# Patient Record
Sex: Female | Born: 1965
Health system: Southern US, Community
[De-identification: ages and names within clinical notes are randomized; demographics above are authoritative.]

## PROBLEM LIST (undated history)

## (undated) DIAGNOSIS — T8859XA Other complications of anesthesia, initial encounter: Secondary | ICD-10-CM

## (undated) DIAGNOSIS — I219 Acute myocardial infarction, unspecified: Secondary | ICD-10-CM

## (undated) DIAGNOSIS — I639 Cerebral infarction, unspecified: Secondary | ICD-10-CM

## (undated) DIAGNOSIS — I10 Essential (primary) hypertension: Secondary | ICD-10-CM

## (undated) DIAGNOSIS — I429 Cardiomyopathy, unspecified: Secondary | ICD-10-CM

## (undated) DIAGNOSIS — E785 Hyperlipidemia, unspecified: Secondary | ICD-10-CM

## (undated) DIAGNOSIS — F32A Depression, unspecified: Secondary | ICD-10-CM

## (undated) DIAGNOSIS — K219 Gastro-esophageal reflux disease without esophagitis: Secondary | ICD-10-CM

## (undated) DIAGNOSIS — I509 Heart failure, unspecified: Secondary | ICD-10-CM

## (undated) DIAGNOSIS — I2510 Atherosclerotic heart disease of native coronary artery without angina pectoris: Secondary | ICD-10-CM

## (undated) DIAGNOSIS — J449 Chronic obstructive pulmonary disease, unspecified: Secondary | ICD-10-CM

## (undated) DIAGNOSIS — I739 Peripheral vascular disease, unspecified: Secondary | ICD-10-CM

## (undated) DIAGNOSIS — I8390 Asymptomatic varicose veins of unspecified lower extremity: Secondary | ICD-10-CM

## (undated) DIAGNOSIS — I079 Rheumatic tricuspid valve disease, unspecified: Secondary | ICD-10-CM

## (undated) DIAGNOSIS — I359 Nonrheumatic aortic valve disorder, unspecified: Secondary | ICD-10-CM

## (undated) DIAGNOSIS — C539 Malignant neoplasm of cervix uteri, unspecified: Secondary | ICD-10-CM

## (undated) HISTORY — DX: Malignant neoplasm of cervix uteri, unspecified: C53.9

## (undated) HISTORY — DX: Depression, unspecified: F32.A

## (undated) HISTORY — PX: MULTIPLE TOOTH EXTRACTIONS: SHX2053

## (undated) HISTORY — PX: CARDIAC CATHETERIZATION: SHX172

## (undated) HISTORY — DX: Essential (primary) hypertension: I10

## (undated) HISTORY — DX: Rheumatic tricuspid valve disease, unspecified: I07.9

## (undated) HISTORY — DX: Heart failure, unspecified: I50.9

## (undated) HISTORY — DX: Atherosclerotic heart disease of native coronary artery without angina pectoris: I25.10

## (undated) HISTORY — DX: Cardiomyopathy, unspecified: I42.9

## (undated) HISTORY — DX: Asymptomatic varicose veins of unspecified lower extremity: I83.90

## (undated) HISTORY — DX: Cerebral infarction, unspecified: I63.9

## (undated) HISTORY — DX: Peripheral vascular disease, unspecified: I73.9

## (undated) HISTORY — DX: Acute myocardial infarction, unspecified: I21.9

## (undated) HISTORY — PX: CORONARY ANGIOPLASTY WITH STENT PLACEMENT: SHX49

## (undated) HISTORY — PX: PERIPHERAL ARTERIAL STENT GRAFT: SHX2220

## (undated) HISTORY — DX: Nonrheumatic aortic valve disorder, unspecified: I35.9

## (undated) HISTORY — DX: Hyperlipidemia, unspecified: E78.5

## (undated) HISTORY — DX: Chronic obstructive pulmonary disease, unspecified: J44.9

## (undated) HISTORY — DX: Gastro-esophageal reflux disease without esophagitis: K21.9

---

## 2000-07-10 HISTORY — PX: ABDOMINAL HYSTERECTOMY: SHX81

## 2002-07-10 HISTORY — PX: HERNIA REPAIR: SHX51

## 2018-02-13 ENCOUNTER — Telehealth: Payer: Self-pay | Admitting: Cardiology

## 2018-02-13 NOTE — Telephone Encounter (Signed)
error 

## 2018-02-14 ENCOUNTER — Telehealth: Payer: Self-pay | Admitting: *Deleted

## 2018-02-14 NOTE — Telephone Encounter (Signed)
Pt stated she would get by here on 8/9 when she gets a ride and will make sure her records from [place] get here by 8/12, appt is on 8/13 with Dr. Agustin Cree

## 2018-02-18 ENCOUNTER — Encounter: Payer: Self-pay | Admitting: Cardiology

## 2018-02-19 ENCOUNTER — Encounter: Payer: Self-pay | Admitting: Cardiology

## 2018-02-19 ENCOUNTER — Ambulatory Visit (INDEPENDENT_AMBULATORY_CARE_PROVIDER_SITE_OTHER): Payer: Medicare Other | Admitting: Cardiology

## 2018-02-19 ENCOUNTER — Ambulatory Visit: Payer: Medicare Other | Admitting: Cardiology

## 2018-02-19 DIAGNOSIS — I6930 Unspecified sequelae of cerebral infarction: Secondary | ICD-10-CM

## 2018-02-19 DIAGNOSIS — I429 Cardiomyopathy, unspecified: Secondary | ICD-10-CM | POA: Insufficient documentation

## 2018-02-19 DIAGNOSIS — J449 Chronic obstructive pulmonary disease, unspecified: Secondary | ICD-10-CM | POA: Insufficient documentation

## 2018-02-19 DIAGNOSIS — I10 Essential (primary) hypertension: Secondary | ICD-10-CM

## 2018-02-19 DIAGNOSIS — E785 Hyperlipidemia, unspecified: Secondary | ICD-10-CM

## 2018-02-19 DIAGNOSIS — I2510 Atherosclerotic heart disease of native coronary artery without angina pectoris: Secondary | ICD-10-CM

## 2018-02-19 DIAGNOSIS — I739 Peripheral vascular disease, unspecified: Secondary | ICD-10-CM

## 2018-02-19 DIAGNOSIS — J418 Mixed simple and mucopurulent chronic bronchitis: Secondary | ICD-10-CM

## 2018-02-19 DIAGNOSIS — I255 Ischemic cardiomyopathy: Secondary | ICD-10-CM | POA: Diagnosis not present

## 2018-02-19 HISTORY — DX: Unspecified sequelae of cerebral infarction: I69.30

## 2018-02-19 HISTORY — DX: Essential (primary) hypertension: I10

## 2018-02-19 HISTORY — DX: Hyperlipidemia, unspecified: E78.5

## 2018-02-19 HISTORY — DX: Peripheral vascular disease, unspecified: I73.9

## 2018-02-19 HISTORY — DX: Atherosclerotic heart disease of native coronary artery without angina pectoris: I25.10

## 2018-02-19 MED ORDER — ROSUVASTATIN CALCIUM 40 MG PO TABS: 40.0000 mg | ORAL_TABLET | Freq: Every day | ORAL | 3 refills | Status: DC

## 2018-02-19 NOTE — Patient Instructions (Signed)
Medication Instructions:  Your physician has recommended you make the following change in your medication:   Increase: rosuvastatin 40 mg daily.    Labwork: Your physician recommends that you return for lab work today: BMP, LFTS, and lipids.   Testing/Procedures: Your physician has requested that you have an echocardiogram. Echocardiography is a painless test that uses sound waves to create images of your heart. It provides your doctor with information about the size and shape of your heart and how well your heart's chambers and valves are working. This procedure takes approximately one hour. There are no restrictions for this procedure.  Your physician has requested that you have a carotid duplex. This test is an ultrasound of the carotid arteries in your neck. It looks at blood flow through these arteries that supply the brain with blood. Allow one hour for this exam. There are no restrictions or special instructions.    Follow-Up: Your physician recommends that you schedule a follow-up appointment in: 1 month  Any Other Special Instructions Will Be Listed Below (If Applicable).  You have been referred to vascular surgery their office should call you within one week if not please call our office.      If you need a refill on your cardiac medications before your next appointment, please call your pharmacy.  ecEchocardiogram An echocardiogram, or echocardiography, uses sound waves (ultrasound) to produce an image of your heart. The echocardiogram is simple, painless, obtained within a short period of time, and offers valuable information to your health care provider. The images from an echocardiogram can provide information such as:  Evidence of coronary artery disease (CAD).  Heart size.  Heart muscle function.  Heart valve function.  Aneurysm detection.  Evidence of a past heart attack.  Fluid buildup around the heart.  Heart muscle thickening.  Assess heart valve  function.  Tell a health care provider about:  Any allergies you have.  All medicines you are taking, including vitamins, herbs, eye drops, creams, and over-the-counter medicines.  Any problems you or family members have had with anesthetic medicines.  Any blood disorders you have.  Any surgeries you have had.  Any medical conditions you have.  Whether you are pregnant or may be pregnant. What happens before the procedure? No special preparation is needed. Eat and drink normally. What happens during the procedure?  In order to produce an image of your heart, gel will be applied to your chest and a wand-like tool (transducer) will be moved over your chest. The gel will help transmit the sound waves from the transducer. The sound waves will harmlessly bounce off your heart to allow the heart images to be captured in real-time motion. These images will then be recorded.  You may need an IV to receive a medicine that improves the quality of the pictures. What happens after the procedure? You may return to your normal schedule including diet, activities, and medicines, unless your health care provider tells you otherwise. This information is not intended to replace advice given to you by your health care provider. Make sure you discuss any questions you have with your health care provider. Document Released: 06/23/2000 Document Revised: 02/12/2016 Document Reviewed: 03/03/2013 Elsevier Interactive Patient Education  2017 [employer].

## 2018-02-19 NOTE — Progress Notes (Signed)
Cardiology Consultation:    Date:  02/19/2018   ID:  Catherine Crawford, DOB 1966-01-02, MRN [id number]  PCP:  Patient, [name]  Cardiologist:  Jenne Campus, MD   Referring MD: No ref. provider found   Chief Complaint  Patient presents with  . New Patient (Initial Visit)    to establish with cardiology  I cannot walk too far without having pain in my legs  History of Present Illness:    Catherine Crawford is a 52 y.o. female who is being seen today for the evaluation of coronary artery disease as well as peripheral vascular disease at the request of No ref. provider found.  Her story is incredible in October of last year she came to hospital because of acute myocardial infarction as well as acute CVA.  Cardiac catheterization was done and 2 stents were implanted.  1 to LAD in 1 to RCA at that time her ejection fraction was 20 to 25%.  She also suffered from CVA at the same time that involve the right side of her body with numbness as well as peripheral vision loss.  Likely should recover from all the trouble after that should be followed by cardiology team as well as vascular team.  She was also find to have completely occluded aorta distally from renal arteries.  She did have a conversation with vascular surgeon about potentially having grafts done however and she decided to relocate to [place] and she would like to be established as a patient here and continue conversation about her problems.  Overall the biggest symptoms that she got his claudication she is that she can walk from front to the back of [employer] but only with the empty bladder if she get full bladder she will not be able to get there because of pain in her legs she have to wait 45 minutes before pain gets better and then she can continue.  Denies having any chest pain tightness squeezing pressure burning chest.  There is some shortness of breath with exertion.  No dizziness no passing out. She did quit smoking last year  and does not smoke anymore she does have family history of premature coronary artery disease practically all family members and that having early coronary artery disease  Past Medical History:  Diagnosis Date  . AVD (aortic valve disease)   . CAD (coronary artery disease)   . Cardiomyopathy ([place])   . Cervical cancer ([place])   . CHF (congestive heart failure) ([place])   . COPD (chronic obstructive pulmonary disease) ([place])   . GERD (gastroesophageal reflux disease)   . Heart attack ([place])   . Hyperlipidemia   . Hypertension   . PAD (peripheral artery disease) ([place])   . Tricuspid valve disorder   . Varicosities of leg     Past Surgical History:  Procedure Laterality Date  . ABDOMINAL HYSTERECTOMY  2002  . CARDIAC CATHETERIZATION    . HERNIA REPAIR  2004  . PERIPHERAL ARTERIAL STENT GRAFT      Current Medications: Current Meds  Medication Sig  . acetaminophen (TYLENOL) 325 MG tablet Take 650 mg by mouth every 6 (six) hours as needed.  Marland Kitchen aspirin EC 81 MG tablet Take 81 mg by mouth daily.  . carvedilol (COREG) 3.125 MG tablet Take 3.125 mg by mouth 2 (two) times daily with a meal.  . clopidogrel (PLAVIX) 75 MG tablet Take 75 mg by mouth daily.  Marland Kitchen losartan (COZAAR) 100 MG tablet Take 100 mg by mouth  daily.   . nitroGLYCERIN (NITROSTAT) 0.4 MG SL tablet Place 0.4 mg under the tongue every 5 (five) minutes as needed.  . pantoprazole (PROTONIX) 40 MG tablet Take 40 mg by mouth daily.  . rosuvastatin (CRESTOR) 20 MG tablet Take 20 mg by mouth daily.  Marland Kitchen spironolactone (ALDACTONE) 25 MG tablet Take 12.5 mg by mouth 2 (two) times daily.      Allergies:   Morphine and related   Social History   Socioeconomic History  . Marital status: Married    Spouse name: Not on file  . Number of children: Not on file  . Years of education: Not on file  . Highest education level: Not on file  Occupational History  . Not on file  Social Needs  . Financial resource strain: Not on file  . Food  insecurity:    Worry: Not on file    Inability: Not on file  . Transportation needs:    Medical: Not on file    Non-medical: Not on file  Tobacco Use  . Smoking status: Former Smoker    Packs/day: 2.00    Years: 37.00    Pack years: 74.00    Types: Cigarettes    Last attempt to quit: 04/2017    Years since quitting: 0.8  . Smokeless tobacco: Never Used  Substance and Sexual Activity  . Alcohol use: Not Currently  . Drug use: Not Currently  . Sexual activity: Not on file  Lifestyle  . Physical activity:    Days per week: Not on file    Minutes per session: Not on file  . Stress: Not on file  Relationships  . Social connections:    Talks on phone: Not on file    Gets together: Not on file    Attends religious service: Not on file    Active member of club or organization: Not on file    Attends meetings of clubs or organizations: Not on file    Relationship status: Not on file  Other Topics Concern  . Not on file  Social History Narrative  . Not on file     Family History: The patient's family history includes CAD in her brother, father, and mother; Cancer in her brother, father, and mother; Heart attack in her father, mother, and sister. ROS:   Please see the history of present illness.    All 14 point review of systems negative except as described per history of present illness.  EKGs/Labs/Other Studies Reviewed:    The following studies were reviewed today: I reviewed all records that are available that include carotid ultrasounds which showed no critical lesion on both sides.  Also echocardiogram which showed ejection fraction 50 to 55%.  I do not have any report of cardiac catheterization  EKG:  EKG is  ordered today.  The ekg ordered today demonstrates normal sinus rhythm, normal P interval, normal QS complex duration morphology, no ST-T segment changes  Recent Labs: No results found for requested labs within last 8760 hours.  Recent Lipid Panel No results  found for: CHOL, TRIG, HDL, CHOLHDL, VLDL, LDLCALC, LDLDIRECT  Physical Exam:    VS:  BP (!) 154/84 (BP Location: Left Arm, Patient Position: Sitting, Cuff Size: Large)   Pulse 65   Ht 5\' 4"  (1.626 m)   Wt 229 lb 6.4 oz (104.1 kg)   SpO2 97%   BMI 39.38 kg/m     Wt Readings from Last 3 Encounters:  02/19/18 229 lb 6.4 oz (  104.1 kg)     GEN:  Well nourished, well developed in no acute distress HEENT: Normal NECK: No JVD; No carotid bruits LYMPHATICS: No lymphadenopathy CARDIAC: RRR, no murmurs, no rubs, no gallops RESPIRATORY:  Clear to auscultation without rales, wheezing or rhonchi  ABDOMEN: Soft, non-tender, non-distended MUSCULOSKELETAL:  No edema; No deformity  SKIN: Warm and dry NEUROLOGIC:  Alert and oriented x 3 PSYCHIATRIC:  Normal affect   ASSESSMENT:    1. Coronary artery disease involving native coronary artery of native heart without angina pectoris   2. Claudication in peripheral vascular disease ([place])   3. Late effect of cerebrovascular accident (CVA)   4. Ischemic cardiomyopathy   5. Essential hypertension   6. Dyslipidemia   7. Mixed simple and mucopurulent chronic bronchitis (HCC)    PLAN:    In order of problems listed above:  1. Advanced and aggressive atherosclerosis.  Threasa Beards does have coronary artery disease appears to be asymptomatic right now but this is something we will need to reevaluate in the future I will start from getting her echocardiogram to assess left ventricular ejection fraction.  Also carotid ultrasounds will be done to recheck on carotid artery stenosis.  I will refer her to vascular team with consideration of intervening on her lower extremities since she does have completely occluded aorta probably bypass surgery will be the best way to proceed.  If we decided to do it then I will need to do stress testing on her to evaluate her for reactivation of her problem.  Luckily her EKG looks normal today.  Which is surprising.  Again  echocardiogram will be done to recheck left ventricular ejection fraction. 2. Claudication with peripheral vascular disease: Plan as outlined above 3. Ischemic cardia myopathy she is on ARB as well as beta-blocker which is appropriate I will continue.  We will recheck left ventricular ejection fraction doing echocardiogram 4. Essential hypertension: Blood pressure well controlled continue present management. 5. Dyslipidemia I will check a fasting lipid profile today.  I will maximize her medication already putting her on maximal dose of Crestor 40 mg daily. 6. COPD: Luckily she quit smoking.  Overall young lady with multiple medical problems.  I see her back in my office about 3 weeks or sooner she had a problem   Medication Adjustments/Labs and Tests Ordered: Current medicines are reviewed at length with the patient today.  Concerns regarding medicines are outlined above.  No orders of the defined types were placed in this encounter.  No orders of the defined types were placed in this encounter.   Signed, Park Liter, MD, [facility]. 02/19/2018 3:06 PM    [facility]

## 2018-02-20 LAB — LIPID PANEL
CHOLESTEROL TOTAL: 136 mg/dL (ref 100–199)
Chol/HDL Ratio: 2.8 ratio (ref 0.0–4.4)
HDL: 48 mg/dL (ref >39)
LDL CALC: 73 mg/dL (ref 0–99)
Triglycerides: 77 mg/dL (ref 0–149)
VLDL CHOLESTEROL CAL: 15 mg/dL (ref 5–40)

## 2018-02-20 LAB — HEPATIC FUNCTION PANEL
ALT: 10 IU/L (ref 0–32)
AST: 10 IU/L (ref 0–40)
Albumin: 4.1 g/dL (ref 3.5–5.5)
Alkaline Phosphatase: 91 IU/L (ref 39–117)
Bilirubin Total: 0.2 mg/dL (ref 0.0–1.2)
Bilirubin, Direct: 0.05 mg/dL (ref 0.00–0.40)
TOTAL PROTEIN: 7.2 g/dL (ref 6.0–8.5)

## 2018-02-20 LAB — BASIC METABOLIC PANEL WITH GFR
BUN/Creatinine Ratio: 13 (ref 9–23)
BUN: 15 mg/dL (ref 6–24)
CO2: 25 mmol/L (ref 20–29)
Calcium: 9.3 mg/dL (ref 8.7–10.2)
Chloride: 104 mmol/L (ref 96–106)
Creatinine, Ser: 1.14 mg/dL — ABNORMAL HIGH (ref 0.57–1.00)
GFR calc Af Amer: 63 mL/min/1.73
GFR calc non Af Amer: 55 mL/min/1.73 — ABNORMAL LOW
Glucose: 93 mg/dL (ref 65–99)
Potassium: 4.4 mmol/L (ref 3.5–5.2)
Sodium: 143 mmol/L (ref 134–144)

## 2018-02-21 ENCOUNTER — Other Ambulatory Visit: Payer: Self-pay

## 2018-02-21 ENCOUNTER — Telehealth: Payer: Self-pay

## 2018-02-21 DIAGNOSIS — I739 Peripheral vascular disease, unspecified: Secondary | ICD-10-CM

## 2018-02-21 NOTE — Telephone Encounter (Signed)
Patient was notified of lab results.

## 2018-03-21 ENCOUNTER — Ambulatory Visit: Payer: Medicare Other | Admitting: Cardiology

## 2018-03-22 ENCOUNTER — Telehealth: Payer: Self-pay | Admitting: *Deleted

## 2018-03-22 NOTE — Telephone Encounter (Signed)
Advised patient to consult with dentist about oral surgery referral. Patient verbally understands.

## 2018-03-22 NOTE — Telephone Encounter (Signed)
Pt would like recommendation for dental surgeon in our area. Needs some teeth pulled and with her heart issues didn't want to go to just anybody. Please advise

## 2018-03-26 ENCOUNTER — Other Ambulatory Visit: Payer: Self-pay

## 2018-03-26 ENCOUNTER — Other Ambulatory Visit: Payer: Self-pay | Admitting: Vascular Surgery

## 2018-03-26 ENCOUNTER — Encounter: Payer: Self-pay | Admitting: Vascular Surgery

## 2018-03-26 ENCOUNTER — Ambulatory Visit (INDEPENDENT_AMBULATORY_CARE_PROVIDER_SITE_OTHER): Payer: Medicare Other | Admitting: Vascular Surgery

## 2018-03-26 ENCOUNTER — Ambulatory Visit (HOSPITAL_COMMUNITY)
Admission: RE | Admit: 2018-03-26 | Discharge: 2018-03-26 | Disposition: A | Discharge status: Home / Self Care | Payer: Medicare Other | Source: Ambulatory Visit | Attending: Vascular Surgery | Admitting: Vascular Surgery

## 2018-03-26 VITALS — BP 158/68 | HR 71 | Resp 18 | Ht 64.0 in | Wt 232.0 lb

## 2018-03-26 DIAGNOSIS — I739 Peripheral vascular disease, unspecified: Secondary | ICD-10-CM | POA: Insufficient documentation

## 2018-03-26 NOTE — Progress Notes (Signed)
Patient name: Catherine Crawford MRN: [id number] DOB: 1965/07/28 Sex: female  REASON FOR CONSULT: Lifestyle limiting claudication of the bilateral lower extremities with history of aortic occlusion  HPI: Catherine Crawford is a 52 y.o. female with history of hypertension, hyperlipidemia, COPD, coronary artery disease status post MI and stroke October 2018 that presents for evaluation of bilateral lower extremity claudication/leg pain in the setting of aortic occlusion.  Patient reports she has been followed by a vascular surgeon at [facility] in [place].  She describes having at least 10 stents put in her bilateral lower extremities.  We do not have any operative notes from her lower extremity interventions.  She does carr a card in her wallet that suggests she has 7 mm stents in the bilateral iliac arteries. She states she had a CT scan done of her abdominal aorta in May 2019 in [place] that showed an infrarenal aortic occlusion.  She was ultimately referred to a vascular surgeon.  Patient states her husband moved her here to [place] in the interim and she is not had any surgery scheduled at this time.  Sounds like she has mostly cramping pain in both of her calves and feet with walking.  States she cannot walk more than 100 feet and has to stop.  Unable to work.  She has no tissue loss at this time.  Regarding rest pain she has some atypical symptoms in both of her feet at night that she describes as cramping in the arches of her feet that awaken her from sleep.  Patient states she stopped smoking after her stroke and MI in October 2018.  She previously smoked for 37 years.  Regarding her stroke she says she has residual numbness of her right face, right arm, and right leg but is still able to walk.  Regarding abdominal surgery she states she has had a previous hysterectomy.  Past Medical History:  Diagnosis Date  . AVD (aortic valve disease)   . CAD (coronary artery disease)   .  Cardiomyopathy ([place])   . Cervical cancer ([place])   . CHF (congestive heart failure) ([place])   . COPD (chronic obstructive pulmonary disease) ([place])   . GERD (gastroesophageal reflux disease)   . Heart attack ([place])   . Hyperlipidemia   . Hypertension   . PAD (peripheral artery disease) ([place])   . Stroke ([place])   . Tricuspid valve disorder   . Varicosities of leg     Past Surgical History:  Procedure Laterality Date  . ABDOMINAL HYSTERECTOMY  2002  . CARDIAC CATHETERIZATION    . CORONARY ANGIOPLASTY WITH STENT PLACEMENT    . HERNIA REPAIR  2004  . PERIPHERAL ARTERIAL STENT GRAFT      Family History  Problem Relation Age of Onset  . Heart attack Mother   . CAD Mother   . Cancer Mother   . Heart disease Mother   . AAA (abdominal aortic aneurysm) Mother   . Heart attack Father   . CAD Father   . Cancer Father   . Heart disease Father   . Heart attack Sister   . Heart disease Sister   . CAD Brother   . Cancer Brother   . Heart disease Brother     SOCIAL HISTORY: Social History   Socioeconomic History  . Marital status: Married    Spouse name: Not on file  . Number of children: Not on file  . Years of education: Not on file  . Highest education  level: Not on file  Occupational History  . Not on file  Social Needs  . Financial resource strain: Not on file  . Food insecurity:    Worry: Not on file    Inability: Not on file  . Transportation needs:    Medical: Not on file    Non-medical: Not on file  Tobacco Use  . Smoking status: Former Smoker    Packs/day: 2.00    Years: 37.00    Pack years: 74.00    Types: Cigarettes    Last attempt to quit: 04/2017    Years since quitting: 0.9  . Smokeless tobacco: Never Used  Substance and Sexual Activity  . Alcohol use: Not Currently  . Drug use: Not Currently  . Sexual activity: Not on file  Lifestyle  . Physical activity:    Days per week: Not on file    Minutes per session: Not on file  . Stress: Not on file    Relationships  . Social connections:    Talks on phone: Not on file    Gets together: Not on file    Attends religious service: Not on file    Active member of club or organization: Not on file    Attends meetings of clubs or organizations: Not on file    Relationship status: Not on file  . Intimate partner violence:    Fear of current or ex partner: Not on file    Emotionally abused: Not on file    Physically abused: Not on file    Forced sexual activity: Not on file  Other Topics Concern  . Not on file  Social History Narrative  . Not on file    Allergies  Allergen Reactions  . Morphine And Related Nausea And Vomiting    Current Outpatient Medications  Medication Sig Dispense Refill  . acetaminophen (TYLENOL) 325 MG tablet Take 650 mg by mouth every 6 (six) hours as needed.    Marland Kitchen aspirin EC 81 MG tablet Take 81 mg by mouth daily.    . carvedilol (COREG) 3.125 MG tablet Take 3.125 mg by mouth 2 (two) times daily with a meal.    . clopidogrel (PLAVIX) 75 MG tablet Take 75 mg by mouth daily.    Marland Kitchen losartan (COZAAR) 100 MG tablet Take 100 mg by mouth daily.     . nitroGLYCERIN (NITROSTAT) 0.4 MG SL tablet Place 0.4 mg under the tongue every 5 (five) minutes as needed.    . pantoprazole (PROTONIX) 40 MG tablet Take 40 mg by mouth daily.    . rosuvastatin (CRESTOR) 40 MG tablet Take 1 tablet (40 mg total) by mouth daily. 90 tablet 3  . spironolactone (ALDACTONE) 25 MG tablet Take 12.5 mg by mouth 2 (two) times daily.      No current facility-administered medications for this visit.     REVIEW OF SYSTEMS:  [X]  denotes positive finding, [ ]  denotes negative finding Cardiac  Comments:  Chest pain or chest pressure:    Shortness of breath upon exertion: x   Short of breath when lying flat:    Irregular heart rhythm:        Vascular    Pain in calf, thigh, or hip brought on by ambulation: x   Pain in feet at night that wakes you up from your sleep:     Blood clot in your  veins:    Leg swelling:  x       Pulmonary    Oxygen  at home:    Productive cough:     Wheezing:         Neurologic    Sudden weakness in arms or legs:     Sudden numbness in arms or legs:     Sudden onset of difficulty speaking or slurred speech:    Temporary loss of vision in one eye:     Problems with dizziness:         Gastrointestinal    Blood in stool:     Vomited blood:         Genitourinary    Burning when urinating:     Blood in urine:        Psychiatric    Major depression:         Hematologic    Bleeding problems:    Problems with blood clotting too easily:        Skin    Rashes or ulcers:        Constitutional    Fever or chills:      PHYSICAL EXAM: Vitals:   03/26/18 1149 03/26/18 1153  BP: (!) 161/65 (!) 158/68  Pulse: 71   Resp: 18   SpO2: 100%   Weight: 232 lb (105.2 kg)   Height: 5\' 4"  (1.626 m)     GENERAL: The patient is a well-nourished female, in no acute distress. The vital signs are documented above. CARDIAC: There is a regular rate and rhythm.  VASCULAR:  2+ palpable radial pulse palpable bilateral upper extremities. 2+ palpable brachial pulse bilateral upper extremity. No palpable femoral pulses in either groin. No palpable pedal pulses in either lower extremity. Monophasic dorsalis pedis and posterior tibial signals in both feet. No signs of tissue loss in either foot. PULMONARY: There is good air exchange bilaterally without wheezing or rales. ABDOMEN: Obese with large pannus.  Soft and non-tender with normal pitched bowel sounds.  MUSCULOSKELETAL: There are no major deformities or cyanosis. NEUROLOGIC: No focal weakness or paresthesias are detected. SKIN: There are no ulcers or rashes noted. PSYCHIATRIC: The patient has a normal affect.  DATA:   We do not have her CT scan from [place] for review.  There is also no report in the chart from the CT scan.  Assessment/Plan:  52 year old female with lifestyle limiting  claudication of bilateral lower extremities in the setting of multiple iliac and infrainguinal endovascular interventions in [place].  I do not have her CT to review but she states she was told she had a infrarenal aortic occlusion.  I cannot palpate femoral pulses in either groin which would be consistent with her stated history.  Her ABIs today are 0.4 in the bilateral lower extremity with monophasic waveforms.  She has no tissue loss at this time and mainly endorses lifestyle limiting claudication with some atypical rest pain symptoms.  We will plan to obtain a new CTA abdomen pelvis with bilateral lower extremity runoff. In the interim she has also been seen by cardiology here and has an echocardiogram scheduled for the end of this month.  My plan would be to see her back the beginning of October once her CTA, echocardiogram, and subsequent follow-up with cardiology are complete.  At that time we will have a better idea about her options moving forward.  Discussed aortobifem versus axillary bifem pending her cardiac clearance.  States her heart was so bad in [place] the surgeon told her she probably couldn't undergo an aortic operation.   Marty Heck, MD [facility] Office: [phone] Pager: [place]

## 2018-04-03 ENCOUNTER — Telehealth: Payer: Self-pay | Admitting: Cardiology

## 2018-04-03 NOTE — Telephone Encounter (Signed)
No need for abx

## 2018-04-03 NOTE — Telephone Encounter (Signed)
Left message for patient to call office back.

## 2018-04-03 NOTE — Telephone Encounter (Signed)
Patient needs a call regarding being put on antibiotic for dental surgery.

## 2018-04-04 NOTE — Telephone Encounter (Signed)
Informed patient that she does not need abx.

## 2018-04-08 ENCOUNTER — Ambulatory Visit (INDEPENDENT_AMBULATORY_CARE_PROVIDER_SITE_OTHER): Payer: Medicare Other

## 2018-04-08 DIAGNOSIS — I2510 Atherosclerotic heart disease of native coronary artery without angina pectoris: Secondary | ICD-10-CM

## 2018-04-08 DIAGNOSIS — I255 Ischemic cardiomyopathy: Secondary | ICD-10-CM

## 2018-04-08 DIAGNOSIS — I6930 Unspecified sequelae of cerebral infarction: Secondary | ICD-10-CM

## 2018-04-08 NOTE — Progress Notes (Signed)
Complete echocardiogram has been performed.  Jimmy Zakyah Yanes RDCS, RVT 

## 2018-04-08 NOTE — Progress Notes (Addendum)
Carotid duplex exam has been performed. Bilateral atherosclerosis, Right ECA stenosis > 50%, bilateral ICA stenosis 1-39%

## 2018-04-10 ENCOUNTER — Encounter: Payer: Self-pay | Admitting: Cardiology

## 2018-04-10 ENCOUNTER — Ambulatory Visit (INDEPENDENT_AMBULATORY_CARE_PROVIDER_SITE_OTHER): Payer: Medicare Other | Admitting: Cardiology

## 2018-04-10 VITALS — BP 130/64 | HR 55 | Ht 64.0 in | Wt 231.8 lb

## 2018-04-10 DIAGNOSIS — I2510 Atherosclerotic heart disease of native coronary artery without angina pectoris: Secondary | ICD-10-CM | POA: Diagnosis not present

## 2018-04-10 DIAGNOSIS — I10 Essential (primary) hypertension: Secondary | ICD-10-CM

## 2018-04-10 DIAGNOSIS — E785 Hyperlipidemia, unspecified: Secondary | ICD-10-CM

## 2018-04-10 DIAGNOSIS — J418 Mixed simple and mucopurulent chronic bronchitis: Secondary | ICD-10-CM | POA: Diagnosis not present

## 2018-04-10 DIAGNOSIS — I6930 Unspecified sequelae of cerebral infarction: Secondary | ICD-10-CM

## 2018-04-10 DIAGNOSIS — I255 Ischemic cardiomyopathy: Secondary | ICD-10-CM | POA: Diagnosis not present

## 2018-04-10 DIAGNOSIS — I739 Peripheral vascular disease, unspecified: Secondary | ICD-10-CM

## 2018-04-10 MED ORDER — LOSARTAN POTASSIUM 100 MG PO TABS: 100.0000 mg | ORAL_TABLET | Freq: Every day | ORAL | 1 refills | Status: DC

## 2018-04-10 MED ORDER — EZETIMIBE 10 MG PO TABS: 10.0000 mg | ORAL_TABLET | Freq: Every day | ORAL | 1 refills | Status: DC

## 2018-04-10 NOTE — Patient Instructions (Signed)
Medication Instructions:  Your physician has recommended you make the following change in your medication:  START Zetia 10 mg 1 tab daily  Labwork: None ordered  Testing/Procedures: Your physician has requested that you have a lexiscan myoview. For further information please visit [email or web address]. Please follow instruction sheet, as given.  Follow-Up: Your physician recommends that you schedule a follow-up appointment in: 2 months with Dr. Agustin Cree   Any Other Special Instructions Will Be Listed Below (If Applicable).     If you need a refill on your cardiac medications before your next appointment, please call your pharmacy.

## 2018-04-10 NOTE — Progress Notes (Signed)
Cardiology Office Note:    Date:  04/10/2018   ID:  Catherine Crawford, DOB 1966-01-01, MRN [id number]  PCP:  Patient, [name]  Cardiologist:  Jenne Campus, MD    Referring MD: No ref. provider found   Chief Complaint  Patient presents with  . Follow up on testing  Doing well  History of Present Illness:    Catherine Crawford is a 52 y.o. female with multiple medical problems.  He does have significant advanced and aggressive atherosclerosis which include coronary artery disease status post myocardial infarction last year with a complication stroke.  Also history of peripheral vascular disease with what appears to be completely occluded distal aorta.  She comes today to my office for follow-up she is doing fine obviously she does have claudication while walking but no tightness squeezing pressure burning chest.  She did see dentist and she required multiple teeth extraction.  This can be done under local anesthesia which have no objections from doing it.  She also seen vascular specialist with consideration of aortoiliac grafts.  She is scheduled to have CT tomorrow to clearly delineate what the problem seems to be.  From my point of view she will require stress testing before surgery since surgery is considered a significant risk for somebody with advanced coronary artery disease.  But first I will let her recover from her dental surgery.  Past Medical History:  Diagnosis Date  . AVD (aortic valve disease)   . CAD (coronary artery disease)   . Cardiomyopathy ([place])   . Cervical cancer ([place])   . CHF (congestive heart failure) ([place])   . COPD (chronic obstructive pulmonary disease) ([place])   . GERD (gastroesophageal reflux disease)   . Heart attack ([place])   . Hyperlipidemia   . Hypertension   . PAD (peripheral artery disease) ([place])   . Stroke ([place])   . Tricuspid valve disorder   . Varicosities of leg     Past Surgical History:  Procedure Laterality Date  . ABDOMINAL HYSTERECTOMY   2002  . CARDIAC CATHETERIZATION    . CORONARY ANGIOPLASTY WITH STENT PLACEMENT    . HERNIA REPAIR  2004  . PERIPHERAL ARTERIAL STENT GRAFT      Current Medications: Current Meds  Medication Sig  . acetaminophen (TYLENOL) 325 MG tablet Take 650 mg by mouth every 6 (six) hours as needed.  Marland Kitchen aspirin EC 81 MG tablet Take 81 mg by mouth daily.  . carvedilol (COREG) 3.125 MG tablet Take 3.125 mg by mouth 2 (two) times daily with a meal.  . clopidogrel (PLAVIX) 75 MG tablet Take 75 mg by mouth daily.  Marland Kitchen losartan (COZAAR) 100 MG tablet Take 100 mg by mouth daily.   . nitroGLYCERIN (NITROSTAT) 0.4 MG SL tablet Place 0.4 mg under the tongue every 5 (five) minutes as needed.  . pantoprazole (PROTONIX) 40 MG tablet Take 40 mg by mouth daily.  . rosuvastatin (CRESTOR) 40 MG tablet Take 1 tablet (40 mg total) by mouth daily.  Marland Kitchen spironolactone (ALDACTONE) 25 MG tablet Take 12.5 mg by mouth 2 (two) times daily.      Allergies:   Morphine and related   Social History   Socioeconomic History  . Marital status: Married    Spouse name: Not on file  . Number of children: Not on file  . Years of education: Not on file  . Highest education level: Not on file  Occupational History  . Not on file  Social Needs  . Financial  resource strain: Not on file  . Food insecurity:    Worry: Not on file    Inability: Not on file  . Transportation needs:    Medical: Not on file    Non-medical: Not on file  Tobacco Use  . Smoking status: Former Smoker    Packs/day: 2.00    Years: 37.00    Pack years: 74.00    Types: Cigarettes    Last attempt to quit: 04/2017    Years since quitting: 1.0  . Smokeless tobacco: Never Used  Substance and Sexual Activity  . Alcohol use: Not Currently  . Drug use: Not Currently  . Sexual activity: Not on file  Lifestyle  . Physical activity:    Days per week: Not on file    Minutes per session: Not on file  . Stress: Not on file  Relationships  . Social connections:     Talks on phone: Not on file    Gets together: Not on file    Attends religious service: Not on file    Active member of club or organization: Not on file    Attends meetings of clubs or organizations: Not on file    Relationship status: Not on file  Other Topics Concern  . Not on file  Social History Narrative  . Not on file     Family History: The patient's family history includes AAA (abdominal aortic aneurysm) in her mother; CAD in her brother, father, and mother; Cancer in her brother, father, and mother; Heart attack in her father, mother, and sister; Heart disease in her brother, father, mother, and sister. ROS:   Please see the history of present illness.    All 14 point review of systems negative except as described per history of present illness  EKGs/Labs/Other Studies Reviewed:      Recent Labs: 02/19/2018: ALT 10; BUN 15; Creatinine, Ser 1.14; Potassium 4.4; Sodium 143  Recent Lipid Panel    Component Value Date/Time   CHOL 136 02/19/2018 1529   TRIG 77 02/19/2018 1529   HDL 48 02/19/2018 1529   CHOLHDL 2.8 02/19/2018 1529   LDLCALC 73 02/19/2018 1529    Physical Exam:    VS:  BP 130/64   Pulse (!) 55   Ht 5\' 4"  (1.626 m)   Wt 231 lb 12.8 oz (105.1 kg)   SpO2 94%   BMI 39.79 kg/m     Wt Readings from Last 3 Encounters:  04/10/18 231 lb 12.8 oz (105.1 kg)  03/26/18 232 lb (105.2 kg)  02/19/18 229 lb 6.4 oz (104.1 kg)     GEN:  Well nourished, well developed in no acute distress HEENT: Normal NECK: No JVD; No carotid bruits LYMPHATICS: No lymphadenopathy CARDIAC: RRR, no murmurs, no rubs, no gallops RESPIRATORY:  Clear to auscultation without rales, wheezing or rhonchi  ABDOMEN: Soft, non-tender, non-distended MUSCULOSKELETAL:  No edema; No deformity  SKIN: Warm and dry LOWER EXTREMITIES: no swelling NEUROLOGIC:  Alert and oriented x 3 PSYCHIATRIC:  Normal affect   ASSESSMENT:    1. Coronary artery disease involving native coronary artery  of native heart without angina pectoris   2. Ischemic cardiomyopathy   3. Essential hypertension   4. Mixed simple and mucopurulent chronic bronchitis ([place])   5. Dyslipidemia   6. Late effect of cerebrovascular accident (CVA)   7. Claudication in peripheral vascular disease ([place])    PLAN:    In order of problems listed above:  1. Coronary artery disease plan as  outlined above we will schedule her to have [place] which will be done probably in about a month.  In the meantime we will continue present medications. 2. Ischemic cardiomyopathy.  Last ejection fraction actually preserved she is on appropriate medications which I will continue. 3. Essential hypertension blood pressure well controlled we will continue present management. 4. Dyslipidemia her LDL is still more than 70 I will add Zetia to her medical regimen. 5. Late effect of CVA stable. 6. Claudication: Followed by vascular surgery with intention to intervene.  CT scheduled for tomorrow. 7. Smoking she quit more than year ago and I encouraged her to stay away from smoking.   Medication Adjustments/Labs and Tests Ordered: Current medicines are reviewed at length with the patient today.  Concerns regarding medicines are outlined above.  No orders of the defined types were placed in this encounter.  Medication changes: No orders of the defined types were placed in this encounter.   Signed, Park Liter, MD, [facility] 04/10/2018 2:50 PM    [place]

## 2018-04-11 ENCOUNTER — Ambulatory Visit
Admission: RE | Admit: 2018-04-11 | Discharge: 2018-04-11 | Disposition: A | Discharge status: Home / Self Care | Payer: Medicare Other | Source: Ambulatory Visit | Attending: Vascular Surgery | Admitting: Vascular Surgery

## 2018-04-11 DIAGNOSIS — I739 Peripheral vascular disease, unspecified: Secondary | ICD-10-CM

## 2018-04-11 MED ORDER — IOPAMIDOL (ISOVUE-370) INJECTION 76%
125.0000 mL | Freq: Once | INTRAVENOUS | Status: AC | PRN
  Administered 2018-04-11: 125 mL via INTRAVENOUS

## 2018-04-16 ENCOUNTER — Other Ambulatory Visit: Payer: Self-pay

## 2018-04-16 ENCOUNTER — Encounter: Payer: Self-pay | Admitting: Vascular Surgery

## 2018-04-16 ENCOUNTER — Ambulatory Visit (INDEPENDENT_AMBULATORY_CARE_PROVIDER_SITE_OTHER): Payer: Medicare Other | Admitting: Vascular Surgery

## 2018-04-16 ENCOUNTER — Ambulatory Visit: Payer: Medicare Other | Admitting: Vascular Surgery

## 2018-04-16 DIAGNOSIS — I700 Atherosclerosis of aorta: Secondary | ICD-10-CM

## 2018-04-16 DIAGNOSIS — I7410 Embolism and thrombosis of unspecified parts of aorta: Secondary | ICD-10-CM | POA: Diagnosis not present

## 2018-04-16 HISTORY — DX: Atherosclerosis of aorta: I70.0

## 2018-04-16 NOTE — Progress Notes (Signed)
Patient name: Catherine Crawford MRN: [id number] DOB: [birth year] Sex: female  REASON FOR CONSULT: Lifestyle limiting claudication of the bilateral lower extremities with history of aortic occlusion  HPI: Catherine Crawford is a 52 y.o. female with history of hypertension, hyperlipidemia, COPD, coronary artery disease status post MI and stroke October 2018 that presents for evaluation of bilateral lower extremity claudication/leg pain in the setting of aortic occlusion.  Patient reports she has been followed by a vascular surgeon at [facility] in [place].  Presents today after updated CTA was obtained.    States her feet are about the same.  Still cannot walk more than about 100 feet due to severe cramping in both of her calves.  She has been seen by Dr. Agustin Cree her cardiologist and had a echocardiogram with a preserved EF.  She is scheduled for a Lexiscan beginning of November for cardiac clearance.  In the interim she has been seen by dentist and has several tooth abscesses.  She has a referral with a oral surgeon next week to have her teeth pulled.  She did get her CTA in the interim which I have been able to review and it shows a infrarenal aortic occlusion just below her renal arteries as well as occlusion of her bilateral iliacs.  All of the aortoiliac endovascular stent work is occluded.  She appears to reconstitute her common femoral arteries bilaterally.  Past Medical History:  Diagnosis Date  . AVD (aortic valve disease)   . CAD (coronary artery disease)   . Cardiomyopathy ([place])   . Cervical cancer ([place])   . CHF (congestive heart failure) ([place])   . COPD (chronic obstructive pulmonary disease) ([place])   . GERD (gastroesophageal reflux disease)   . Heart attack ([place])   . Hyperlipidemia   . Hypertension   . PAD (peripheral artery disease) ([place])   . Stroke ([place])   . Tricuspid valve disorder   . Varicosities of leg     Past Surgical History:  Procedure Laterality Date  .  ABDOMINAL HYSTERECTOMY  2002  . CARDIAC CATHETERIZATION    . CORONARY ANGIOPLASTY WITH STENT PLACEMENT    . HERNIA REPAIR  2004  . PERIPHERAL ARTERIAL STENT GRAFT      Family History  Problem Relation Age of Onset  . Heart attack Mother   . CAD Mother   . Cancer Mother   . Heart disease Mother   . AAA (abdominal aortic aneurysm) Mother   . Heart attack Father   . CAD Father   . Cancer Father   . Heart disease Father   . Heart attack Sister   . Heart disease Sister   . CAD Brother   . Cancer Brother   . Heart disease Brother     SOCIAL HISTORY: Social History   Socioeconomic History  . Marital status: Married    Spouse name: Not on file  . Number of children: Not on file  . Years of education: Not on file  . Highest education level: Not on file  Occupational History  . Not on file  Social Needs  . Financial resource strain: Not on file  . Food insecurity:    Worry: Not on file    Inability: Not on file  . Transportation needs:    Medical: Not on file    Non-medical: Not on file  Tobacco Use  . Smoking status: Former Smoker    Packs/day: 2.00    Years: 37.00    Pack years: 74.00  Types: Cigarettes    Last attempt to quit: 04/2017    Years since quitting: 1.0  . Smokeless tobacco: Never Used  Substance and Sexual Activity  . Alcohol use: Not Currently  . Drug use: Not Currently  . Sexual activity: Not on file  Lifestyle  . Physical activity:    Days per week: Not on file    Minutes per session: Not on file  . Stress: Not on file  Relationships  . Social connections:    Talks on phone: Not on file    Gets together: Not on file    Attends religious service: Not on file    Active member of club or organization: Not on file    Attends meetings of clubs or organizations: Not on file    Relationship status: Not on file  . Intimate partner violence:    Fear of current or ex partner: Not on file    Emotionally abused: Not on file    Physically abused:  Not on file    Forced sexual activity: Not on file  Other Topics Concern  . Not on file  Social History Narrative  . Not on file    Allergies  Allergen Reactions  . Morphine And Related Nausea And Vomiting    Current Outpatient Medications  Medication Sig Dispense Refill  . acetaminophen (TYLENOL) 325 MG tablet Take 650 mg by mouth every 6 (six) hours as needed.    Marland Kitchen amoxicillin-clavulanate (AUGMENTIN) 875-125 MG tablet   0  . aspirin EC 81 MG tablet Take 81 mg by mouth daily.    . carvedilol (COREG) 3.125 MG tablet Take 3.125 mg by mouth 2 (two) times daily with a meal.    . clopidogrel (PLAVIX) 75 MG tablet Take 75 mg by mouth daily.    Marland Kitchen ezetimibe (ZETIA) 10 MG tablet Take 1 tablet (10 mg total) by mouth daily. 30 tablet 1  . losartan (COZAAR) 100 MG tablet Take 1 tablet (100 mg total) by mouth daily. 90 tablet 1  . nitroGLYCERIN (NITROSTAT) 0.4 MG SL tablet Place 0.4 mg under the tongue every 5 (five) minutes as needed.    . pantoprazole (PROTONIX) 40 MG tablet Take 40 mg by mouth daily.    . rosuvastatin (CRESTOR) 40 MG tablet Take 1 tablet (40 mg total) by mouth daily. 90 tablet 3  . spironolactone (ALDACTONE) 25 MG tablet Take 12.5 mg by mouth 2 (two) times daily.      No current facility-administered medications for this visit.     REVIEW OF SYSTEMS:  [X]  denotes positive finding, [ ]  denotes negative finding Cardiac  Comments:  Chest pain or chest pressure:    Shortness of breath upon exertion:    Short of breath when lying flat:    Irregular heart rhythm:        Vascular    Pain in calf, thigh, or hip brought on by ambulation: x   Pain in feet at night that wakes you up from your sleep:     Blood clot in your veins:    Leg swelling:         Pulmonary    Oxygen at home:    Productive cough:     Wheezing:         Neurologic    Sudden weakness in arms or legs:     Sudden numbness in arms or legs:     Sudden onset of difficulty speaking or slurred speech:  Temporary loss of vision in one eye:     Problems with dizziness:         Gastrointestinal    Blood in stool:     Vomited blood:         Genitourinary    Burning when urinating:     Blood in urine:        Psychiatric    Major depression:         Hematologic    Bleeding problems:    Problems with blood clotting too easily:        Skin    Rashes or ulcers:        Constitutional    Fever or chills:      PHYSICAL EXAM: Vitals:   04/16/18 0932  BP: 127/68  Pulse: 64  Resp: 16  Temp: 97.6 F (36.4 C)  TempSrc: Oral  SpO2: 100%  Weight: 104.8 kg  Height: 5\' 4"  (1.626 m)    GENERAL: The patient is a well-nourished female, in no acute distress. The vital signs are documented above. CARDIAC: There is a regular rate and rhythm.  VASCULAR:  2+ palpable radial pulse palpable bilateral upper extremities. No palpable femoral pulses in either groin. No palpable pedal pulses in either lower extremity. Monophasic dorsalis pedis and posterior tibial signals in both feet. No signs of tissue loss in either foot. PULMONARY: There is good air exchange bilaterally without wheezing or rales. ABDOMEN: Obese with large pannus.  Soft and non-tender with normal pitched bowel sounds.  MUSCULOSKELETAL: There are no major deformities or cyanosis.     DATA:   CTA independently reviewed by me and it shows a infrarenal aortic occlusion just below her renal arteries.  Her aortobiiliac endovascular stents are occluded.  She appears to reconstitute her common femoral arteries bilaterally.  There is to patent runoff in the bilateral lower extremities through SFA popliteal and three-vessel tibial runoff.  Assessment/Plan:   I had a long discussion with Ms. Sick today regarding options moving forward.  After review of her CTA she indeed has an infra-renal aortic occlusion below her renal arteries.  The previous aortobililac stenting done in [place] is all occluded.  She would need either an  aortobifemoral bypass versus axillary bifemoral.  Concerned about patency of axillary based bypass in 52 yo, but her abdomen is hostile given three previous hernia repairs with open mesh and morbidly obese.  In addition, her groins are exceedingly high risk given she is morbidly obese.  However in the interim she has been seen by a dentist and has multiple tooth abscesses and is being referred to a oral surgeon.  Discussed that we need to get this addressed before proceeding with any prosthetic based bypass.  Moreover she has been seen by her cardiologist and an echo is complete but she has a [place] ordered the beginning of November. I proposed we follow-up again after dental work and cardiac stress to make a decision moving forwrad.  She has short distance claudication and her limbs are not in immediate threat.  Marty Heck, MD [facility] Office: [phone] Pager: [place]

## 2018-04-25 ENCOUNTER — Telehealth: Payer: Self-pay | Admitting: Cardiology

## 2018-04-25 NOTE — Telephone Encounter (Signed)
Spoke with Oral surgery center. Faxed clearance there.

## 2018-04-25 NOTE — Telephone Encounter (Signed)
Patient needs clearance for oral surgery for using Versaid and Fentanyl all remaining teeth removed except for two lower canines.. Please fax pre-op note for patient to dental office/[facility]

## 2018-05-09 ENCOUNTER — Telehealth (HOSPITAL_COMMUNITY): Payer: Self-pay | Admitting: *Deleted

## 2018-05-09 NOTE — Telephone Encounter (Signed)
Patient given detailed instructions per Myocardial Perfusion Study Information Sheet for the test on 05/14/18. Patient notified to arrive 15 minutes early and that it is imperative to arrive on time for appointment to keep from having the test rescheduled.  If you need to cancel or reschedule your appointment, please call the office within 24 hours of your appointment. . Patient verbalized understanding. Guillermo Difrancesco Jacqueline    

## 2018-05-14 ENCOUNTER — Ambulatory Visit (INDEPENDENT_AMBULATORY_CARE_PROVIDER_SITE_OTHER): Payer: Medicare Other

## 2018-05-14 ENCOUNTER — Telehealth: Payer: Self-pay | Admitting: Cardiology

## 2018-05-14 ENCOUNTER — Ambulatory Visit: Payer: Medicare Other

## 2018-05-14 VITALS — Wt 231.0 lb

## 2018-05-14 DIAGNOSIS — I255 Ischemic cardiomyopathy: Secondary | ICD-10-CM

## 2018-05-14 DIAGNOSIS — I2510 Atherosclerotic heart disease of native coronary artery without angina pectoris: Secondary | ICD-10-CM

## 2018-05-14 DIAGNOSIS — E785 Hyperlipidemia, unspecified: Secondary | ICD-10-CM

## 2018-05-14 MED ORDER — TECHNETIUM TC 99M TETROFOSMIN IV KIT
32.7000 | PACK | Freq: Once | INTRAVENOUS | Status: AC | PRN
  Administered 2018-05-14: 32.7 via INTRAVENOUS

## 2018-05-14 MED ORDER — EZETIMIBE 10 MG PO TABS: 10.0000 mg | ORAL_TABLET | Freq: Every day | ORAL | 1 refills | Status: DC

## 2018-05-14 MED ORDER — PANTOPRAZOLE SODIUM 40 MG PO TBEC: 40.0000 mg | DELAYED_RELEASE_TABLET | Freq: Every day | ORAL | 1 refills | Status: DC

## 2018-05-14 MED ORDER — REGADENOSON 0.4 MG/5ML IV SOLN
0.4000 mg | Freq: Once | INTRAVENOUS | Status: AC
  Administered 2018-05-14: 0.4 mg via INTRAVENOUS

## 2018-05-14 NOTE — Telephone Encounter (Signed)
Pt here for lexiscan and states she wants pantoprazole and ezetimibe called to [facility] on [address] in [place]

## 2018-05-14 NOTE — Addendum Note (Signed)
Addended by: Ashok Norris on: 05/14/2018 11:38 AM   Modules accepted: Orders

## 2018-05-14 NOTE — Telephone Encounter (Signed)
Zetia 10 mg daily and protonix 40 mg daily refilled

## 2018-05-15 ENCOUNTER — Ambulatory Visit: Payer: Medicare Other

## 2018-05-15 LAB — MYOCARDIAL PERFUSION IMAGING
CHL CUP NUCLEAR SDS: 3
CHL CUP NUCLEAR SRS: 1
CHL CUP NUCLEAR SSS: 4
LV sys vol: 34 mL
LVDIAVOL: 91 mL (ref 46–106)
Peak HR: 103 {beats}/min
Rest HR: 56 {beats}/min
TID: 0.95

## 2018-05-15 MED ORDER — TECHNETIUM TC 99M TETROFOSMIN IV KIT
32.6000 | PACK | Freq: Once | INTRAVENOUS | Status: AC | PRN
  Administered 2018-05-15: 32.6 via INTRAVENOUS

## 2018-05-21 ENCOUNTER — Ambulatory Visit (INDEPENDENT_AMBULATORY_CARE_PROVIDER_SITE_OTHER): Payer: Medicare Other | Admitting: Vascular Surgery

## 2018-05-21 ENCOUNTER — Other Ambulatory Visit: Payer: Self-pay

## 2018-05-21 ENCOUNTER — Encounter: Payer: Self-pay | Admitting: Vascular Surgery

## 2018-05-21 VITALS — BP 134/69 | HR 76 | Resp 18 | Ht 64.0 in | Wt 226.7 lb

## 2018-05-21 DIAGNOSIS — I7410 Embolism and thrombosis of unspecified parts of aorta: Secondary | ICD-10-CM | POA: Diagnosis not present

## 2018-05-21 DIAGNOSIS — I700 Atherosclerosis of aorta: Secondary | ICD-10-CM

## 2018-05-21 MED ORDER — CILOSTAZOL 100 MG PO TABS: 100.0000 mg | ORAL_TABLET | Freq: Two times a day (BID) | ORAL | 5 refills | Status: DC

## 2018-05-21 NOTE — Progress Notes (Signed)
Patient name: Catherine Crawford MRN: [id number] DOB: 07/29/1965 Sex: female  REASON FOR CONSULT: Lifestyle limiting claudication of the bilateral lower extremities with history of aortic occlusion  HPI: Catherine Crawford is a 52 y.o. female with history of hypertension, hyperlipidemia, COPD, coronary artery disease status post MI and stroke October 2018 that presents for follow-up of bilateral lower extremity claudication/leg pain in the setting of aortic occlusion.  Patient reports she had been followed by a vascular surgeon at [facility] in [place].    Since her last visit states still has symptoms consistent with claudication in her legs and hurts when she walks.  Denies any rest pain at night.  Denies any tissue loss.  States sometimes she can walk to the back of the store and other times more limited but can usually walk at least 100 - 200 feet before stopping.  She did have a Lexiscan on 05/14/2018 that showed an EF of 62% with no ST deviation.  He has continued to stop smoking.  States she has lost 5 pounds.   Past Medical History:  Diagnosis Date  . AVD (aortic valve disease)   . CAD (coronary artery disease)   . Cardiomyopathy ([place])   . Cervical cancer ([place])   . CHF (congestive heart failure) ([place])   . COPD (chronic obstructive pulmonary disease) ([place])   . GERD (gastroesophageal reflux disease)   . Heart attack ([place])   . Hyperlipidemia   . Hypertension   . PAD (peripheral artery disease) ([place])   . Stroke ([place])   . Tricuspid valve disorder   . Varicosities of leg     Past Surgical History:  Procedure Laterality Date  . ABDOMINAL HYSTERECTOMY  2002  . CARDIAC CATHETERIZATION    . CORONARY ANGIOPLASTY WITH STENT PLACEMENT    . HERNIA REPAIR  2004  . PERIPHERAL ARTERIAL STENT GRAFT      Family History  Problem Relation Age of Onset  . Heart attack Mother   . CAD Mother   . Cancer Mother   . Heart disease Mother   . AAA (abdominal aortic aneurysm) Mother   .  Heart attack Father   . CAD Father   . Cancer Father   . Heart disease Father   . Heart attack Sister   . Heart disease Sister   . CAD Brother   . Cancer Brother   . Heart disease Brother     SOCIAL HISTORY: Social History   Socioeconomic History  . Marital status: Married    Spouse name: Not on file  . Number of children: Not on file  . Years of education: Not on file  . Highest education level: Not on file  Occupational History  . Not on file  Social Needs  . Financial resource strain: Not on file  . Food insecurity:    Worry: Not on file    Inability: Not on file  . Transportation needs:    Medical: Not on file    Non-medical: Not on file  Tobacco Use  . Smoking status: Former Smoker    Packs/day: 2.00    Years: 37.00    Pack years: 74.00    Types: Cigarettes    Last attempt to quit: 04/2017    Years since quitting: 1.1  . Smokeless tobacco: Never Used  Substance and Sexual Activity  . Alcohol use: Not Currently  . Drug use: Not Currently  . Sexual activity: Not on file  Lifestyle  . Physical activity:  Days per week: Not on file    Minutes per session: Not on file  . Stress: Not on file  Relationships  . Social connections:    Talks on phone: Not on file    Gets together: Not on file    Attends religious service: Not on file    Active member of club or organization: Not on file    Attends meetings of clubs or organizations: Not on file    Relationship status: Not on file  . Intimate partner violence:    Fear of current or ex partner: Not on file    Emotionally abused: Not on file    Physically abused: Not on file    Forced sexual activity: Not on file  Other Topics Concern  . Not on file  Social History Narrative  . Not on file    Allergies  Allergen Reactions  . Morphine And Related Nausea And Vomiting    Current Outpatient Medications  Medication Sig Dispense Refill  . acetaminophen (TYLENOL) 325 MG tablet Take 650 mg by mouth every 6  (six) hours as needed.    Marland Kitchen amoxicillin-clavulanate (AUGMENTIN) 875-125 MG tablet   0  . aspirin EC 81 MG tablet Take 81 mg by mouth daily.    . carvedilol (COREG) 3.125 MG tablet Take 3.125 mg by mouth 2 (two) times daily with a meal.    . clopidogrel (PLAVIX) 75 MG tablet Take 75 mg by mouth daily.    Marland Kitchen ezetimibe (ZETIA) 10 MG tablet Take 1 tablet (10 mg total) by mouth daily. 90 tablet 1  . losartan (COZAAR) 100 MG tablet Take 1 tablet (100 mg total) by mouth daily. 90 tablet 1  . nitroGLYCERIN (NITROSTAT) 0.4 MG SL tablet Place 0.4 mg under the tongue every 5 (five) minutes as needed.    . pantoprazole (PROTONIX) 40 MG tablet Take 1 tablet (40 mg total) by mouth daily. 90 tablet 1  . spironolactone (ALDACTONE) 25 MG tablet Take 12.5 mg by mouth 2 (two) times daily.     . cilostazol (PLETAL) 100 MG tablet Take 1 tablet (100 mg total) by mouth 2 (two) times daily. 60 tablet 5  . rosuvastatin (CRESTOR) 40 MG tablet Take 1 tablet (40 mg total) by mouth daily. 90 tablet 3   No current facility-administered medications for this visit.     REVIEW OF SYSTEMS:  [X]  denotes positive finding, [ ]  denotes negative finding Cardiac  Comments:  Chest pain or chest pressure:    Shortness of breath upon exertion:    Short of breath when lying flat:    Irregular heart rhythm:        Vascular    Pain in calf, thigh, or hip brought on by ambulation: x   Pain in feet at night that wakes you up from your sleep:     Blood clot in your veins:    Leg swelling:         Pulmonary    Oxygen at home:    Productive cough:     Wheezing:         Neurologic    Sudden weakness in arms or legs:     Sudden numbness in arms or legs:     Sudden onset of difficulty speaking or slurred speech:    Temporary loss of vision in one eye:     Problems with dizziness:         Gastrointestinal    Blood in stool:  Vomited blood:         Genitourinary    Burning when urinating:     Blood in urine:          Psychiatric    Major depression:         Hematologic    Bleeding problems:    Problems with blood clotting too easily:        Skin    Rashes or ulcers:        Constitutional    Fever or chills:      PHYSICAL EXAM: Vitals:   05/21/18 0823  BP: 134/69  Pulse: 76  Resp: 18  SpO2: 97%  Weight: 102.8 kg  Height: 5\' 4"  (1.626 m)    GENERAL: The patient is a well-nourished female, in no acute distress. The vital signs are documented above. CARDIAC: There is a regular rate and rhythm.  VASCULAR:  2+ palpable radial pulse palpable bilateral upper extremities. No palpable femoral pulses in either groin. No palpable pedal pulses in either lower extremity. Monophasic dorsalis pedis and posterior tibial signals in both feet. No signs of tissue loss in either foot.  DATA:   CTA previously independently reviewed by me and it shows a infrarenal aortic occlusion just below her renal arteries.  Her aortobiiliac endovascular stents are occluded.  She appears to reconstitute her common femoral arteries bilaterally.  There is to patent runoff in the bilateral lower extremities through SFA popliteal and three-vessel tibial runoff.  Assessment/Plan:  I had a long discussion with Ms. Yarborough and her husband in clinic today regarding the best course moving forward.  She has a chronic aortic occlusion and her symptoms are consistent with claudication.  I reviewed with them that this is not a limb threatening situation and does not qualify as critical limb ischmia.  If her symptoms were to progress to rest pain or nonhealing wound then I would be more aggressive about moving forward with surgery.  After review of her CT scan she needs an aortobifemoral bypass and I do not feel a axillary based bypass would be durable in a 52 year old.  My concern at this time is that she has a hostile abdomen given multiple previous ventral hernia repairs with mesh and given her BMI of nearly 40 I think she is at  exceedingly high risk for a groin complication that could be life-threatening particularly if her graft gets infected.  I have discussed her case with several partners and I have recommended that we hold off on any surgical intervention at this time and I have prescribed her Pletal 100 mg twice a day to see if that improves her claudication symptoms.  In the setting of aortic occlusion this may not be helpful but it is at least worth trying.  She needs to continue to not smoke and discussed any attempt at weight loss moving forward would be beneficial for future surgical intervention.  I will see her back at 3 months to monitor her symptoms.    Marty Heck, MD [facility] Office: [phone] Pager: [place]

## 2018-05-27 ENCOUNTER — Telehealth: Payer: Self-pay | Admitting: Emergency Medicine

## 2018-05-27 ENCOUNTER — Other Ambulatory Visit: Payer: Self-pay | Admitting: Cardiology

## 2018-05-27 NOTE — Telephone Encounter (Signed)
Called again with questions about medicines

## 2018-05-27 NOTE — Telephone Encounter (Signed)
Patient reports recently being prescribed cilostazol100 mg twice daily by vascular surgeon due to them being unable to do bypass. She wants to make sure Dr. Agustin Cree approves this medication. Will consult with him

## 2018-05-28 NOTE — Telephone Encounter (Signed)
Patient informed that per Dr. Agustin Cree she is approved to start cilostazol 100 mg twice daily

## 2018-06-19 ENCOUNTER — Ambulatory Visit: Payer: Medicare Other | Admitting: Cardiology

## 2018-07-17 ENCOUNTER — Ambulatory Visit (INDEPENDENT_AMBULATORY_CARE_PROVIDER_SITE_OTHER): Payer: Medicare Other | Admitting: Cardiology

## 2018-07-17 ENCOUNTER — Encounter: Payer: Self-pay | Admitting: Cardiology

## 2018-07-17 VITALS — BP 120/60 | HR 85 | Ht 64.0 in | Wt 231.0 lb

## 2018-07-17 DIAGNOSIS — I6930 Unspecified sequelae of cerebral infarction: Secondary | ICD-10-CM

## 2018-07-17 DIAGNOSIS — E785 Hyperlipidemia, unspecified: Secondary | ICD-10-CM

## 2018-07-17 DIAGNOSIS — I255 Ischemic cardiomyopathy: Secondary | ICD-10-CM

## 2018-07-17 DIAGNOSIS — I739 Peripheral vascular disease, unspecified: Secondary | ICD-10-CM

## 2018-07-17 DIAGNOSIS — I2510 Atherosclerotic heart disease of native coronary artery without angina pectoris: Secondary | ICD-10-CM | POA: Diagnosis not present

## 2018-07-17 LAB — HEPATIC FUNCTION PANEL
ALT: 11 IU/L (ref 0–32)
AST: 11 IU/L (ref 0–40)
Albumin: 4.5 g/dL (ref 3.5–5.5)
Alkaline Phosphatase: 92 IU/L (ref 39–117)
BILIRUBIN TOTAL: 0.3 mg/dL (ref 0.0–1.2)
Bilirubin, Direct: 0.09 mg/dL (ref 0.00–0.40)
Total Protein: 7.4 g/dL (ref 6.0–8.5)

## 2018-07-17 LAB — LIPID PANEL
CHOL/HDL RATIO: 2.3 ratio (ref 0.0–4.4)
CHOLESTEROL TOTAL: 138 mg/dL (ref 100–199)
HDL: 59 mg/dL (ref >39)
LDL CALC: 65 mg/dL (ref 0–99)
Triglycerides: 69 mg/dL (ref 0–149)
VLDL CHOLESTEROL CAL: 14 mg/dL (ref 5–40)

## 2018-07-17 MED ORDER — CLOPIDOGREL BISULFATE 75 MG PO TABS: 75.0000 mg | ORAL_TABLET | Freq: Every day | ORAL | 3 refills | Status: DC

## 2018-07-17 NOTE — Progress Notes (Signed)
Cardiology Office Note:    Date:  07/17/2018   ID:  Catherine Crawford, DOB [birth year], MRN [id number]  PCP:  Patient, [name]  Cardiologist:  Jenne Campus, MD    Referring MD: No ref. provider found   Chief Complaint  Patient presents with  . Follow up on Nuclear testing  Doing well  History of Present Illness:    Catherine Crawford is a 52 y.o. female with very complex past medical history which include advanced coronary artery disease also complete occlusion of the aortic distal abdominal comes today to my office for follow-up she went to a dentist she got multiple teeth extraction now she does have dentures and she is very happy with it cardiac wise doing well denies have any chest pain tightness squeezing pressure been chest.  She did have a stress test done recently which showed no evidence of ischemia she is being also evaluated by vascular surgeon for possibility of fixing of her talk about the distal aortic occlusion with bypass however this feeling was that it safe for her to have conservative approach she is able to walk and about 200 to 200 feet then she would start hurting she understands she need to keep exercising and that seems to be helping overall she said she is doing much better now as compared to before.  Past Medical History:  Diagnosis Date  . AVD (aortic valve disease)   . CAD (coronary artery disease)   . Cardiomyopathy ([place])   . Cervical cancer ([place])   . CHF (congestive heart failure) ([place])   . COPD (chronic obstructive pulmonary disease) ([place])   . GERD (gastroesophageal reflux disease)   . Heart attack ([place])   . Hyperlipidemia   . Hypertension   . PAD (peripheral artery disease) ([place])   . Stroke ([place])   . Tricuspid valve disorder   . Varicosities of leg     Past Surgical History:  Procedure Laterality Date  . ABDOMINAL HYSTERECTOMY  2002  . CARDIAC CATHETERIZATION    . CORONARY ANGIOPLASTY WITH STENT PLACEMENT    . HERNIA REPAIR  2004  .  PERIPHERAL ARTERIAL STENT GRAFT      Current Medications: Current Meds  Medication Sig  . aspirin EC 81 MG tablet Take 81 mg by mouth daily.  . carvedilol (COREG) 3.125 MG tablet TAKE 1 TABLET BY MOUTH TWICE DAILY  . cilostazol (PLETAL) 100 MG tablet Take 1 tablet (100 mg total) by mouth 2 (two) times daily.  . clopidogrel (PLAVIX) 75 MG tablet Take 1 tablet (75 mg total) by mouth at bedtime.  Marland Kitchen ezetimibe (ZETIA) 10 MG tablet Take 1 tablet (10 mg total) by mouth daily.  Marland Kitchen losartan (COZAAR) 100 MG tablet Take 1 tablet (100 mg total) by mouth daily.  . Magnesium 250 MG TABS Take 1 tablet by mouth daily.  . nitroGLYCERIN (NITROSTAT) 0.4 MG SL tablet Place 0.4 mg under the tongue every 5 (five) minutes as needed.  . pantoprazole (PROTONIX) 40 MG tablet Take 1 tablet (40 mg total) by mouth daily.  . rosuvastatin (CRESTOR) 40 MG tablet Take 1 tablet (40 mg total) by mouth daily.  Marland Kitchen spironolactone (ALDACTONE) 25 MG tablet TAKE 1/2 TABLET BY MOUTH TWICE DAILY  . [DISCONTINUED] clopidogrel (PLAVIX) 75 MG tablet TAKE 1 TABLET BY MOUTH EVERY NIGHT     Allergies:   Morphine and related   Social History   Socioeconomic History  . Marital status: Married    Spouse name: Not on file  .  Number of children: Not on file  . Years of education: Not on file  . Highest education level: Not on file  Occupational History  . Not on file  Social Needs  . Financial resource strain: Not on file  . Food insecurity:    Worry: Not on file    Inability: Not on file  . Transportation needs:    Medical: Not on file    Non-medical: Not on file  Tobacco Use  . Smoking status: Former Smoker    Packs/day: 2.00    Years: 37.00    Pack years: 74.00    Types: Cigarettes    Last attempt to quit: 04/2017    Years since quitting: 1.2  . Smokeless tobacco: Never Used  Substance and Sexual Activity  . Alcohol use: Not Currently  . Drug use: Not Currently  . Sexual activity: Not on file  Lifestyle  . Physical  activity:    Days per week: Not on file    Minutes per session: Not on file  . Stress: Not on file  Relationships  . Social connections:    Talks on phone: Not on file    Gets together: Not on file    Attends religious service: Not on file    Active member of club or organization: Not on file    Attends meetings of clubs or organizations: Not on file    Relationship status: Not on file  Other Topics Concern  . Not on file  Social History Narrative  . Not on file     Family History: The patient's family history includes AAA (abdominal aortic aneurysm) in her mother; CAD in her brother, father, and mother; Cancer in her brother, father, and mother; Heart attack in her father, mother, and sister; Heart disease in her brother, father, mother, and sister. ROS:   Please see the history of present illness.    All 14 point review of systems negative except as described per history of present illness  EKGs/Labs/Other Studies Reviewed:      Recent Labs: 02/19/2018: ALT 10; BUN 15; Creatinine, Ser 1.14; Potassium 4.4; Sodium 143  Recent Lipid Panel    Component Value Date/Time   CHOL 136 02/19/2018 1529   TRIG 77 02/19/2018 1529   HDL 48 02/19/2018 1529   CHOLHDL 2.8 02/19/2018 1529   LDLCALC 73 02/19/2018 1529    Physical Exam:    VS:  BP 120/60   Pulse 85   Ht 5\' 4"  (1.626 m)   Wt 231 lb (104.8 kg)   SpO2 98%   BMI 39.65 kg/m     Wt Readings from Last 3 Encounters:  07/17/18 231 lb (104.8 kg)  05/21/18 226 lb 11.2 oz (102.8 kg)  05/14/18 231 lb (104.8 kg)     GEN:  Well nourished, well developed in no acute distress HEENT: Normal NECK: No JVD; No carotid bruits LYMPHATICS: No lymphadenopathy CARDIAC: RRR, no murmurs, no rubs, no gallops RESPIRATORY:  Clear to auscultation without rales, wheezing or rhonchi  ABDOMEN: Soft, non-tender, non-distended MUSCULOSKELETAL:  No edema; No deformity  SKIN: Warm and dry LOWER EXTREMITIES: no swelling NEUROLOGIC:  Alert and  oriented x 3 PSYCHIATRIC:  Normal affect   ASSESSMENT:    1. Dyslipidemia   2. Coronary artery disease involving native coronary artery of native heart without angina pectoris   3. Ischemic cardiomyopathy   4. Claudication in peripheral vascular disease ([place])   5. Late effect of cerebrovascular accident (CVA)    PLAN:  In order of problems listed above:  1. Dyslipidemia we will check a fasting lipid profile today 2. Coronary disease on appropriate medications which I will continue.  Stable the recent stress test negative 3. Ischemic cardiomyopathy both stress test and echocardiogram showed preserved left ventricular ejection fraction. 4. Claudication with peripheral vascular disease conservative approach doing well from that point of view   Medication Adjustments/Labs and Tests Ordered: Current medicines are reviewed at length with the patient today.  Concerns regarding medicines are outlined above.  Orders Placed This Encounter  Procedures  . Hepatic function panel  . Lipid panel   Medication changes:  Meds ordered this encounter  Medications  . clopidogrel (PLAVIX) 75 MG tablet    Sig: Take 1 tablet (75 mg total) by mouth at bedtime.    Dispense:  90 tablet    Refill:  3    Signed, Park Liter, MD, [facility] 07/17/2018 10:00 AM    [place]

## 2018-07-17 NOTE — Patient Instructions (Signed)
Medication Instructions:   Your physician recommends that you continue on your current medications as directed. Please refer to the Current Medication list given to you today.  If you need a refill on your cardiac medications before your next appointment, please call your pharmacy.   Lab work:  Your physician recommends that you return for lab work today: LFT, Lipids.   If you have labs (blood work) drawn today and your tests are completely normal, you will receive your results only by: Marland Kitchen MyChart Message (if you have MyChart) OR . A paper copy in the mail If you have any lab test that is abnormal or we need to change your treatment, we will call you to review the results.  Testing/Procedures:  NONE  Follow-Up: At [facility], you and your health needs are our priority.  As part of our continuing mission to provide you with exceptional heart care, we have created designated Provider Care Teams.  These Care Teams include your primary Cardiologist (physician) and Advanced Practice Providers (APPs -  Physician Assistants and Nurse Practitioners) who all work together to provide you with the care you need, when you need it.  . You will need a follow up appointment in 4 months.

## 2018-08-13 ENCOUNTER — Other Ambulatory Visit: Payer: Self-pay

## 2018-08-13 ENCOUNTER — Ambulatory Visit (INDEPENDENT_AMBULATORY_CARE_PROVIDER_SITE_OTHER): Payer: Medicare Other | Admitting: Vascular Surgery

## 2018-08-13 ENCOUNTER — Encounter: Payer: Self-pay | Admitting: Vascular Surgery

## 2018-08-13 VITALS — BP 108/79 | HR 75 | Temp 97.3°F | Resp 20 | Ht 64.0 in | Wt 231.0 lb

## 2018-08-13 DIAGNOSIS — I7410 Embolism and thrombosis of unspecified parts of aorta: Secondary | ICD-10-CM

## 2018-08-13 DIAGNOSIS — I700 Atherosclerosis of aorta: Secondary | ICD-10-CM

## 2018-08-13 DIAGNOSIS — I739 Peripheral vascular disease, unspecified: Secondary | ICD-10-CM | POA: Diagnosis not present

## 2018-08-13 NOTE — Progress Notes (Signed)
Patient name: Catherine Crawford MRN: [id number] DOB: [birth year] Sex: female  REASON FOR CONSULT: Lifestyle limiting claudication of the bilateral lower extremities with history of aortic occlusion  HPI: Catherine Crawford is a 53 y.o. female with history of hypertension, hyperlipidemia, COPD, coronary artery disease status post MI and stroke October 2018 that presents for follow-up of bilateral lower extremity claudication/leg pain in the setting of chronic aortic occlusion.  Patient was previously followed by a vascular surgeon at [facility] in [place].    Since her last visit states still has symptoms consistent with claudication in her legs and hurts when she walks.  Denies any rest pain at night.  Denies any tissue loss.  States trying to walk more.  Can now walk around [employer] from the parking lot.  No weight loss since last visit.  She did have a Lexiscan on 05/14/2018 that showed an EF of 62% with no ST deviation.  He has continued to stop smoking.     Past Medical History:  Diagnosis Date  . AVD (aortic valve disease)   . CAD (coronary artery disease)   . Cardiomyopathy ([place])   . Cervical cancer ([place])   . CHF (congestive heart failure) ([place])   . COPD (chronic obstructive pulmonary disease) ([place])   . GERD (gastroesophageal reflux disease)   . Heart attack ([place])   . Hyperlipidemia   . Hypertension   . PAD (peripheral artery disease) ([place])   . Stroke ([place])   . Tricuspid valve disorder   . Varicosities of leg     Past Surgical History:  Procedure Laterality Date  . ABDOMINAL HYSTERECTOMY  2002  . CARDIAC CATHETERIZATION    . CORONARY ANGIOPLASTY WITH STENT PLACEMENT    . HERNIA REPAIR  2004  . PERIPHERAL ARTERIAL STENT GRAFT      Family History  Problem Relation Age of Onset  . Heart attack Mother   . CAD Mother   . Cancer Mother   . Heart disease Mother   . AAA (abdominal aortic aneurysm) Mother   . Heart attack Father   . CAD Father   . Cancer Father   .  Heart disease Father   . Heart attack Sister   . Heart disease Sister   . CAD Brother   . Cancer Brother   . Heart disease Brother     SOCIAL HISTORY: Social History   Socioeconomic History  . Marital status: Married    Spouse name: Not on file  . Number of children: Not on file  . Years of education: Not on file  . Highest education level: Not on file  Occupational History  . Not on file  Social Needs  . Financial resource strain: Not on file  . Food insecurity:    Worry: Not on file    Inability: Not on file  . Transportation needs:    Medical: Not on file    Non-medical: Not on file  Tobacco Use  . Smoking status: Former Smoker    Packs/day: 2.00    Years: 37.00    Pack years: 74.00    Types: Cigarettes    Last attempt to quit: 04/2017    Years since quitting: 1.3  . Smokeless tobacco: Never Used  Substance and Sexual Activity  . Alcohol use: Not Currently  . Drug use: Not Currently  . Sexual activity: Not on file  Lifestyle  . Physical activity:    Days per week: Not on file    Minutes per  session: Not on file  . Stress: Not on file  Relationships  . Social connections:    Talks on phone: Not on file    Gets together: Not on file    Attends religious service: Not on file    Active member of club or organization: Not on file    Attends meetings of clubs or organizations: Not on file    Relationship status: Not on file  . Intimate partner violence:    Fear of current or ex partner: Not on file    Emotionally abused: Not on file    Physically abused: Not on file    Forced sexual activity: Not on file  Other Topics Concern  . Not on file  Social History Narrative  . Not on file    Allergies  Allergen Reactions  . Morphine And Related Nausea And Vomiting    Current Outpatient Medications  Medication Sig Dispense Refill  . aspirin EC 81 MG tablet Take 81 mg by mouth daily.    . carvedilol (COREG) 3.125 MG tablet TAKE 1 TABLET BY MOUTH TWICE DAILY  180 tablet 0  . cilostazol (PLETAL) 100 MG tablet Take 1 tablet (100 mg total) by mouth 2 (two) times daily. 60 tablet 5  . clopidogrel (PLAVIX) 75 MG tablet Take 1 tablet (75 mg total) by mouth at bedtime. 90 tablet 3  . losartan (COZAAR) 100 MG tablet Take 1 tablet (100 mg total) by mouth daily. 90 tablet 1  . Magnesium 250 MG TABS Take 1 tablet by mouth daily.    . nitroGLYCERIN (NITROSTAT) 0.4 MG SL tablet Place 0.4 mg under the tongue every 5 (five) minutes as needed.    . pantoprazole (PROTONIX) 40 MG tablet Take 1 tablet (40 mg total) by mouth daily. 90 tablet 1  . rosuvastatin (CRESTOR) 40 MG tablet Take 1 tablet (40 mg total) by mouth daily. 90 tablet 3  . spironolactone (ALDACTONE) 25 MG tablet TAKE 1/2 TABLET BY MOUTH TWICE DAILY 90 tablet 0  . ezetimibe (ZETIA) 10 MG tablet Take 1 tablet (10 mg total) by mouth daily. 90 tablet 1   No current facility-administered medications for this visit.     REVIEW OF SYSTEMS:  [X]  denotes positive finding, [ ]  denotes negative finding Cardiac  Comments:  Chest pain or chest pressure:    Shortness of breath upon exertion:    Short of breath when lying flat:    Irregular heart rhythm:        Vascular    Pain in calf, thigh, or hip brought on by ambulation: x   Pain in feet at night that wakes you up from your sleep:     Blood clot in your veins:    Leg swelling:         Pulmonary    Oxygen at home:    Productive cough:     Wheezing:         Neurologic    Sudden weakness in arms or legs:     Sudden numbness in arms or legs:     Sudden onset of difficulty speaking or slurred speech:    Temporary loss of vision in one eye:     Problems with dizziness:         Gastrointestinal    Blood in stool:     Vomited blood:         Genitourinary    Burning when urinating:     Blood in urine:  Psychiatric    Major depression:         Hematologic    Bleeding problems:    Problems with blood clotting too easily:        Skin     Rashes or ulcers:        Constitutional    Fever or chills:      PHYSICAL EXAM: Vitals:   08/13/18 0905  BP: 108/79  Pulse: 75  Resp: 20  Temp: (!) 97.3 F (36.3 C)  SpO2: (!) 75%  Weight: 231 lb (104.8 kg)  Height: 5\' 4"  (1.626 m)    GENERAL: The patient is a well-nourished female, in no acute distress. The vital signs are documented above. CARDIAC: There is a regular rate and rhythm.  VASCULAR:  2+ palpable radial pulse palpable bilateral upper extremities. No palpable femoral pulses in either groin. No palpable pedal pulses in either lower extremity. Monophasic dorsalis pedis and posterior tibial signals in both feet. No signs of tissue loss in either foot.  DATA:   CTA previously independently reviewed by me and it shows a infrarenal aortic occlusion just below her renal arteries.  Her aortobiiliac endovascular stents are occluded.  She appears to reconstitute her common femoral arteries bilaterally.  There is to patent runoff in the bilateral lower extremities through SFA popliteal and three-vessel tibial runoff.  Assessment/Plan:  53 yo F with chronic aortic occlusion and symptoms consistent with claudication. As previously discussed, she would need a aortobifemoral bypass and I do not feel a axillary based bypass would be durable in a 53 year old.  She has a hostile abdomen given previous lower midline laparotomy and subsequent ventral hernia repair with a number of tacks noted on her CT scan.  This is in the setting of morbid obesity and groins that are high risk for wound failure.  Previously discussed that in the absence of rest pain or tissue loss this is not a critical limb threat situation.  Her claudication symptoms are stable today and actually sounds like her mobility may be slightly improving.  We will continue to see each other on an intermittent basis and I will have her follow-up in 6 months and she will contact me if things worsen before then.  Going to  continue conservative measures for now.  Marty Heck, MD [facility] Office: [phone] Pager: [place]

## 2018-08-19 ENCOUNTER — Telehealth: Payer: Self-pay | Admitting: Cardiology

## 2018-08-19 ENCOUNTER — Other Ambulatory Visit: Payer: Self-pay | Admitting: Cardiology

## 2018-08-19 MED ORDER — CARVEDILOL 3.125 MG PO TABS: 3.1250 mg | ORAL_TABLET | Freq: Two times a day (BID) | ORAL | 1 refills | Status: DC

## 2018-08-19 MED ORDER — SPIRONOLACTONE 25 MG PO TABS: 12.5000 mg | ORAL_TABLET | Freq: Two times a day (BID) | ORAL | 0 refills | Status: DC

## 2018-08-19 NOTE — Telephone Encounter (Signed)
°*  STAT* If patient is at the pharmacy, call can be transferred to refill team.   1. Which medications need to be refilled? (please list name of each medication and dose if known) Spironolactone 25mg , and carvedilol 3.125mg   2. Which pharmacy/location (including street and city if local pharmacy) is medication to be sent to? [facility] on [address] [place]  3. Do they need a 30 day or 90 day supply? 90 and 180

## 2018-08-19 NOTE — Telephone Encounter (Signed)
Medication refilled

## 2018-10-11 ENCOUNTER — Other Ambulatory Visit: Payer: Self-pay | Admitting: Cardiology

## 2018-10-11 MED ORDER — LOSARTAN POTASSIUM 100 MG PO TABS: 100.0000 mg | ORAL_TABLET | Freq: Every day | ORAL | 0 refills | Status: DC

## 2018-10-11 NOTE — Telephone Encounter (Signed)
°*  STAT* If patient is at the pharmacy, call can be transferred to refill team.   1. Which medications need to be refilled? (please list name of each medication and dose if known) losartan  2. Which pharmacy/location (including street and city if local pharmacy) is medication to be sent to? [place]  3. Do they need a 30 day or 90 day supply? 90 day

## 2018-11-05 ENCOUNTER — Other Ambulatory Visit: Payer: Self-pay | Admitting: Cardiology

## 2018-11-11 ENCOUNTER — Other Ambulatory Visit: Payer: Self-pay | Admitting: *Deleted

## 2018-11-11 DIAGNOSIS — I739 Peripheral vascular disease, unspecified: Secondary | ICD-10-CM

## 2018-11-11 MED ORDER — CILOSTAZOL 100 MG PO TABS: 100.0000 mg | ORAL_TABLET | Freq: Two times a day (BID) | ORAL | 5 refills | Status: DC

## 2018-12-04 ENCOUNTER — Telehealth: Payer: Self-pay | Admitting: Cardiology

## 2018-12-04 NOTE — Telephone Encounter (Signed)
Virtual Visit Pre-Appointment Phone Call  "(Name), I am calling you today to discuss your upcoming appointment. We are currently trying to limit exposure to the virus that causes COVID-19 by seeing patients at home rather than in the office."  1. "What is the BEST phone number to call the day of the visit?" - include this in appointment notes  2. Do you have or have access to (through a family member/friend) a smartphone with video capability that we can use for your visit?" a. If yes - list this number in appt notes as cell (if different from BEST phone #) and list the appointment type as a VIDEO visit in appointment notes b. If no - list the appointment type as a PHONE visit in appointment notes  3. Confirm consent - "In the setting of the current Covid19 crisis, you are scheduled for a (phone or video) visit with your provider on (date) at (time).  Just as we do with many in-office visits, in order for you to participate in this visit, we must obtain consent.  If you'd like, I can send this to your mychart (if signed up) or email for you to review.  Otherwise, I can obtain your verbal consent now.  All virtual visits are billed to your insurance company just like a normal visit would be.  By agreeing to a virtual visit, we'd like you to understand that the technology does not allow for your provider to perform an examination, and thus may limit your provider's ability to fully assess your condition. If your provider identifies any concerns that need to be evaluated in person, we will make arrangements to do so.  Finally, though the technology is pretty good, we cannot assure that it will always work on either your or our end, and in the setting of a video visit, we may have to convert it to a phone-only visit.  In either situation, we cannot ensure that we have a secure connection.  Are you willing to proceed?" STAFF: Did the patient verbally acknowledge consent to telehealth visit? Document  YES/NO here: YES  4. Advise patient to be prepared - "Two hours prior to your appointment, go ahead and check your blood pressure, pulse, oxygen saturation, and your weight (if you have the equipment to check those) and write them all down. When your visit starts, your provider will ask you for this information. If you have an Apple Watch or Kardia device, please plan to have heart rate information ready on the day of your appointment. Please have a pen and paper handy nearby the day of the visit as well."  5. Give patient instructions for MyChart download to smartphone OR [email or web address] as below if video visit (depending on what platform provider is using)  6. Inform patient they will receive a phone call 15 minutes prior to their appointment time (may be from unknown caller ID) so they should be prepared to answer    TELEPHONE CALL NOTE  Catherine Crawford has been deemed a candidate for a follow-up tele-health visit to limit community exposure during the Covid-19 pandemic. I spoke with the patient via phone to ensure availability of phone/video source, confirm preferred email & phone number, and discuss instructions and expectations.  I reminded Catherine Crawford to be prepared with any vital sign and/or heart rhythm information that could potentially be obtained via home monitoring, at the time of her visit. I reminded Catherine Crawford to expect a phone call prior to her visit.  Isaiah Blakes 12/04/2018 1:14 PM   INSTRUCTIONS FOR DOWNLOADING THE MYCHART APP TO SMARTPHONE  - The patient must first make sure to have activated MyChart and know their login information - If Apple, go to [employer] and type in MyChart in the search bar and download the app. If Android, ask patient to go to [employer] and type in [place] in the search bar and download the app. The app is free but as with any other app downloads, their phone may require them to verify saved payment information or  Apple/Android password.  - The patient will need to then log into the app with their MyChart username and password, and select [facility] as their healthcare provider to link the account. When it is time for your visit, go to the MyChart app, find appointments, and click Begin Video Visit. Be sure to Select Allow for your device to access the Microphone and Camera for your visit. You will then be connected, and your provider will be with you shortly.  **If they have any issues connecting, or need assistance please contact MyChart service desk [phone])**  **If using a computer, in order to ensure the best quality for their visit they will need to use either of the following Internet Browsers: [employer], or Google Chrome**  IF USING DOXIMITY or DOXY.ME - The patient will receive a link just prior to their visit by text.     FULL LENGTH CONSENT FOR TELE-HEALTH VISIT   I hereby voluntarily request, consent and authorize [facility] and its employed or contracted physicians, physician assistants, nurse practitioners or other licensed health care professionals (the Practitioner), to provide me with telemedicine health care services (the Services") as deemed necessary by the treating Practitioner. I acknowledge and consent to receive the Services by the Practitioner via telemedicine. I understand that the telemedicine visit will involve communicating with the Practitioner through live audiovisual communication technology and the disclosure of certain medical information by electronic transmission. I acknowledge that I have been given the opportunity to request an in-person assessment or other available alternative prior to the telemedicine visit and am voluntarily participating in the telemedicine visit.  I understand that I have the right to withhold or withdraw my consent to the use of telemedicine in the course of my care at any time, without affecting my right to future care  or treatment, and that the Practitioner or I may terminate the telemedicine visit at any time. I understand that I have the right to inspect all information obtained and/or recorded in the course of the telemedicine visit and may receive copies of available information for a reasonable fee.  I understand that some of the potential risks of receiving the Services via telemedicine include:   Delay or interruption in medical evaluation due to technological equipment failure or disruption;  Information transmitted may not be sufficient (e.g. poor resolution of images) to allow for appropriate medical decision making by the Practitioner; and/or   In rare instances, security protocols could fail, causing a breach of personal health information.  Furthermore, I acknowledge that it is my responsibility to provide information about my medical history, conditions and care that is complete and accurate to the best of my ability. I acknowledge that Practitioner's advice, recommendations, and/or decision may be based on factors not within their control, such as incomplete or inaccurate data provided by me or distortions of diagnostic images or specimens that may result from electronic transmissions. I understand that the  practice of medicine is not an Chief Strategy Officer and that Practitioner makes no warranties or guarantees regarding treatment outcomes. I acknowledge that I will receive a copy of this consent concurrently upon execution via email to the email address I last provided but may also request a printed copy by calling the office of [facility].    I understand that my insurance will be billed for this visit.   I have read or had this consent read to me.  I understand the contents of this consent, which adequately explains the benefits and risks of the Services being provided via telemedicine.   I have been provided ample opportunity to ask questions regarding this consent and the Services and have had  my questions answered to my satisfaction.  I give my informed consent for the services to be provided through the use of telemedicine in my medical care  By participating in this telemedicine visit I agree to the above.

## 2018-12-12 ENCOUNTER — Telehealth (INDEPENDENT_AMBULATORY_CARE_PROVIDER_SITE_OTHER): Payer: Medicare Other | Admitting: Cardiology

## 2018-12-12 ENCOUNTER — Other Ambulatory Visit: Payer: Self-pay

## 2018-12-12 ENCOUNTER — Encounter: Payer: Self-pay | Admitting: Cardiology

## 2018-12-12 DIAGNOSIS — J418 Mixed simple and mucopurulent chronic bronchitis: Secondary | ICD-10-CM

## 2018-12-12 DIAGNOSIS — E785 Hyperlipidemia, unspecified: Secondary | ICD-10-CM

## 2018-12-12 DIAGNOSIS — I2510 Atherosclerotic heart disease of native coronary artery without angina pectoris: Secondary | ICD-10-CM

## 2018-12-12 DIAGNOSIS — I255 Ischemic cardiomyopathy: Secondary | ICD-10-CM

## 2018-12-12 DIAGNOSIS — I6930 Unspecified sequelae of cerebral infarction: Secondary | ICD-10-CM

## 2018-12-12 DIAGNOSIS — I739 Peripheral vascular disease, unspecified: Secondary | ICD-10-CM

## 2018-12-12 DIAGNOSIS — I700 Atherosclerosis of aorta: Secondary | ICD-10-CM

## 2018-12-12 NOTE — Patient Instructions (Signed)
Medication Instructions:  Your physician recommends that you continue on your current medications as directed. Please refer to the Current Medication list given to you today.  If you need a refill on your cardiac medications before your next appointment, please call your pharmacy.   Lab work: None.  If you have labs (blood work) drawn today and your tests are completely normal, you will receive your results only by: . MyChart Message (if you have MyChart) OR . A paper copy in the mail If you have any lab test that is abnormal or we need to change your treatment, we will call you to review the results.  Testing/Procedures: None.   Follow-Up: At [facility], you and your health needs are our priority.  As part of our continuing mission to provide you with exceptional heart care, we have created designated Provider Care Teams.  These Care Teams include your primary Cardiologist (physician) and Advanced Practice Providers (APPs -  Physician Assistants and Nurse Practitioners) who all work together to provide you with the care you need, when you need it. You will need a follow up appointment in 3 months.  Please call our office 2 months in advance to schedule this appointment.  You may see Robert Krasowski, MD or another member of our [facility] Provider Team in [place]: Brian Munley, MD . Rajan Revankar, MD  Any Other Special Instructions Will Be Listed Below (If Applicable).     

## 2018-12-12 NOTE — Progress Notes (Signed)
Virtual Visit via Telephone Note   This visit type was conducted due to national recommendations for restrictions regarding the COVID-19 Pandemic (e.g. social distancing) in an effort to limit this patient's exposure and mitigate transmission in our community.  Due to her co-morbid illnesses, this patient is at least at moderate risk for complications without adequate follow up.  This format is felt to be most appropriate for this patient at this time.  The patient did not have access to video technology/had technical difficulties with video requiring transitioning to audio format only (telephone).  All issues noted in this document were discussed and addressed.  No physical exam could be performed with this format.  Please refer to the patient's chart for her  consent to telehealth for [facility].  Evaluation Performed:  Follow-up visit  This visit type was conducted due to national recommendations for restrictions regarding the COVID-19 Pandemic (e.g. social distancing).  This format is felt to be most appropriate for this patient at this time.  All issues noted in this document were discussed and addressed.  No physical exam was performed (except for noted visual exam findings with Video Visits).  Please refer to the patient's chart (MyChart message for video visits and phone note for telephone visits) for the patient's consent to telehealth for [facility].  Date:  12/12/2018  ID: Catherine Crawford, DOB 10-03-1965, MRN [id number]   Patient Location: [address] [address]   Provider location:   [place] Office  PCP:  Patient, [name]  Cardiologist:  Jenne Campus, MD     Chief Complaint: Doing well  History of Present Illness:    Catherine Crawford is a 53 y.o. female  who presents via audio/video conferencing for a telehealth visit today.  With advanced coronary artery disease, also cardiomyopathy however latest estimation of left ventricular  ejection fraction showed preserved ejection fraction.  Also aortic occlusion, hypertension, dyslipidemia overall she is doing great she is actually talking to me over the phone she had no technical ability to establish video link.  She pulled out to the store and she is getting ready to go to the store she said she got claudication she said she can walk maybe 100 to 200 feet and then she still getting her locations.  She can go with a cart into the store and walk around and complete shopping and she try to go to store every single day to be able to exercise.  I told her now with coronavirus situation probably not the best idea to exercise and walk in the store I told her it would be better to do it simply on the street.  She still takes all precautions necessary to about coronavirus infection.  Denies having any chest pain tightness squeezing pressure burning chest   The patient does not have symptoms concerning for COVID-19 infection (fever, chills, cough, or new SHORTNESS OF BREATH).    Prior CV studies:   The following studies were reviewed today:       Past Medical History:  Diagnosis Date   AVD (aortic valve disease)    CAD (coronary artery disease)    Cardiomyopathy (HCC)    Cervical cancer (HCC)    CHF (congestive heart failure) (HCC)    COPD (chronic obstructive pulmonary disease) (HCC)    GERD (gastroesophageal reflux disease)    Heart attack ([place])    Hyperlipidemia    Hypertension    PAD (peripheral artery disease) ([place])  Stroke [facility])    Tricuspid valve disorder    Varicosities of leg     Past Surgical History:  Procedure Laterality Date   ABDOMINAL HYSTERECTOMY  2002   CARDIAC CATHETERIZATION     CORONARY ANGIOPLASTY WITH STENT PLACEMENT     HERNIA REPAIR  2004   PERIPHERAL ARTERIAL STENT GRAFT       Current Meds  Medication Sig   aspirin EC 81 MG tablet Take 81 mg by mouth daily.   carvedilol (COREG) 3.125 MG tablet Take 1 tablet (3.125  mg total) by mouth 2 (two) times daily.   cilostazol (PLETAL) 100 MG tablet Take 1 tablet (100 mg total) by mouth 2 (two) times daily.   clopidogrel (PLAVIX) 75 MG tablet Take 1 tablet (75 mg total) by mouth at bedtime.   ezetimibe (ZETIA) 10 MG tablet TAKE 1 TABLET(10 MG) BY MOUTH DAILY   losartan (COZAAR) 100 MG tablet Take 1 tablet (100 mg total) by mouth daily.   Magnesium 250 MG TABS Take 1 tablet by mouth daily.   nitroGLYCERIN (NITROSTAT) 0.4 MG SL tablet Place 0.4 mg under the tongue every 5 (five) minutes as needed.   pantoprazole (PROTONIX) 40 MG tablet TAKE 1 TABLET(40 MG) BY MOUTH DAILY   rosuvastatin (CRESTOR) 40 MG tablet Take 1 tablet (40 mg total) by mouth daily.   spironolactone (ALDACTONE) 25 MG tablet TAKE 1/2 TABLET(12.5 MG) BY MOUTH TWICE DAILY      Family History: The patient's family history includes AAA (abdominal aortic aneurysm) in her mother; CAD in her brother, father, and mother; Cancer in her brother, father, and mother; Heart attack in her father, mother, and sister; Heart disease in her brother, father, mother, and sister.   ROS:   Please see the history of present illness.     All other systems reviewed and are negative.   Labs/Other Tests and Data Reviewed:     Recent Labs: 02/19/2018: BUN 15; Creatinine, Ser 1.14; Potassium 4.4; Sodium 143 07/17/2018: ALT 11  Recent Lipid Panel    Component Value Date/Time   CHOL 138 07/17/2018 0951   TRIG 69 07/17/2018 0951   HDL 59 07/17/2018 0951   CHOLHDL 2.3 07/17/2018 0951   LDLCALC 65 07/17/2018 0951      Exam:    Vital Signs:  There were no vitals taken for this visit.    Wt Readings from Last 3 Encounters:  08/13/18 231 lb (104.8 kg)  07/17/18 231 lb (104.8 kg)  05/21/18 226 lb 11.2 oz (102.8 kg)     Well nourished, well developed in no acute distress. Alert awake oriented x3 happy to be able to talk to me without any distress.  Diagnosis for this visit:   1. Coronary artery  disease involving native coronary artery of native heart without angina pectoris   2. Ischemic cardiomyopathy   3. Aortic occlusion (HCC)   4. Mixed simple and mucopurulent chronic bronchitis ([place])   5. Dyslipidemia   6. Late effect of cerebrovascular accident (CVA)   7. Claudication in peripheral vascular disease ([place])      ASSESSMENT & PLAN:    1.  Coronary disease stable asymptomatic on appropriate medications which I will continue. 2.  Ischemic cardiomyopathy stable without evidence of congestive heart failure. 3.  Aortic occlusion followed by vascular surgeon.  Described to have some claudication but no worsening I advised her to continue walking. 4.  Dyslipidemia cholesterol last time perfect with LDL of 65 and HDL more than 50. 5.  Late effects of CVA denies having any new issues  COVID-19 Education: The signs and symptoms of COVID-19 were discussed with the patient and how to seek care for testing (follow up with PCP or arrange E-visit).  The importance of social distancing was discussed today.  Patient Risk:   After full review of this patients clinical status, I feel that they are at least moderate risk at this time.  Time:   Today, I have spent 19 minutes with the patient with telehealth technology discussing pt health issues.  I spent 5 minutes reviewing her chart before the visit.  Visit was finished at 8:42 AM.    Medication Adjustments/Labs and Tests Ordered: Current medicines are reviewed at length with the patient today.  Concerns regarding medicines are outlined above.  No orders of the defined types were placed in this encounter.  Medication changes: No orders of the defined types were placed in this encounter.    Disposition: Follow-up 3 months  Signed, Park Liter, MD, [facility] 12/12/2018 8:42 AM    [place]

## 2018-12-21 IMAGING — NM NM CHEST EXAM
6 series · 36 of 36 positions shown · non-contrast
Comparison: none

[Series 1: wbr stress-gsp · 6.40mm/px · 6 of 512 frames shown]
[frame 43/512]
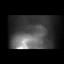
[frame 128/512]
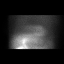
[frame 214/512]
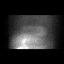
[frame 299/512]
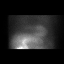
[frame 384/512]
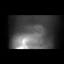
[frame 470/512]
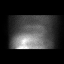

[Series 1: stress sax gs · 6.4mm · 6.40mm/px · 6 of 200 frames shown]
[frame 17/200]
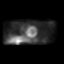
[frame 50/200]
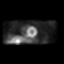
[frame 84/200]
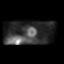
[frame 117/200]
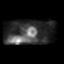
[frame 150/200]
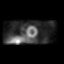
[frame 184/200]
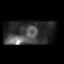

[Series 2: stress sax · 6.4mm · 6.40mm/px · 6 of 25 frames shown]
[frame 3/25]
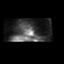
[frame 7/25]
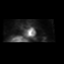
[frame 11/25]
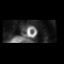
[frame 15/25]
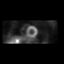
[frame 19/25]
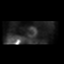
[frame 23/25]
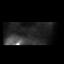

[Series 2: wbr stress-sum-em · 6.40mm/px · 6 of 64 frames shown]
[frame 6/64]
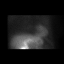
[frame 16/64]
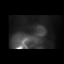
[frame 27/64]
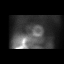
[frame 38/64]
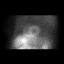
[frame 48/64]
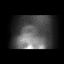
[frame 59/64]
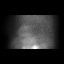

[Series 3: wbr rest · 6.40mm/px · 6 of 64 frames shown]
[frame 6/64]
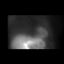
[frame 16/64]
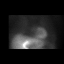
[frame 27/64]
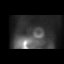
[frame 38/64]
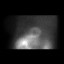
[frame 48/64]
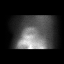
[frame 59/64]
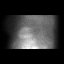

[Series 3: rest sax · 6.4mm · 6.40mm/px · 6 of 25 frames shown]
[frame 3/25]
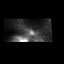
[frame 7/25]
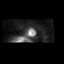
[frame 11/25]
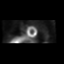
[frame 15/25]
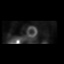
[frame 19/25]
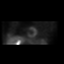
[frame 23/25]
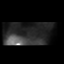

[36 of 36 positions shown; findings below may reference images not displayed]

Canned report from images found in remote index.

Refer to host system for actual result text.

## 2019-01-04 ENCOUNTER — Other Ambulatory Visit: Payer: Self-pay | Admitting: Cardiology

## 2019-01-06 ENCOUNTER — Other Ambulatory Visit: Payer: Self-pay | Admitting: *Deleted

## 2019-01-06 ENCOUNTER — Telehealth: Payer: Self-pay | Admitting: Cardiology

## 2019-01-06 MED ORDER — NITROGLYCERIN 0.4 MG SL SUBL: 0.4000 mg | SUBLINGUAL_TABLET | SUBLINGUAL | 3 refills | Status: DC | PRN

## 2019-01-06 NOTE — Telephone Encounter (Signed)
Nitrostat refilled

## 2019-01-06 NOTE — Telephone Encounter (Signed)
Losartan refill sent to [facility] on [address]

## 2019-01-06 NOTE — Telephone Encounter (Signed)
°*  STAT* If patient is at the pharmacy, call can be transferred to refill team.   1. Which medications need to be refilled? (please list name of each medication and dose if known) nitroGLYCERIN (NITROSTAT) 0.4 MG SL tablet  2. Which pharmacy/location (including street and city if local pharmacy) is medication to be sent to?  [facility] [place], [address] AT [place] [phone] (Phone) [phone] (Fax)    3. Do they need a 30 day or 90 day supply? 90 day

## 2019-02-03 ENCOUNTER — Other Ambulatory Visit: Payer: Self-pay | Admitting: Cardiology

## 2019-02-03 NOTE — Telephone Encounter (Signed)
Rx refill sent to pharmacy. 

## 2019-03-19 ENCOUNTER — Encounter: Payer: Self-pay | Admitting: Cardiology

## 2019-03-19 ENCOUNTER — Other Ambulatory Visit: Payer: Self-pay

## 2019-03-19 ENCOUNTER — Ambulatory Visit (INDEPENDENT_AMBULATORY_CARE_PROVIDER_SITE_OTHER): Payer: Medicare Other | Admitting: Cardiology

## 2019-03-19 ENCOUNTER — Encounter: Payer: Self-pay | Admitting: Neurology

## 2019-03-19 VITALS — BP 118/64 | HR 87 | Ht 64.0 in | Wt 227.0 lb

## 2019-03-19 DIAGNOSIS — I255 Ischemic cardiomyopathy: Secondary | ICD-10-CM | POA: Diagnosis not present

## 2019-03-19 DIAGNOSIS — I6930 Unspecified sequelae of cerebral infarction: Secondary | ICD-10-CM

## 2019-03-19 DIAGNOSIS — I7410 Embolism and thrombosis of unspecified parts of aorta: Secondary | ICD-10-CM | POA: Diagnosis not present

## 2019-03-19 DIAGNOSIS — I2510 Atherosclerotic heart disease of native coronary artery without angina pectoris: Secondary | ICD-10-CM | POA: Diagnosis not present

## 2019-03-19 DIAGNOSIS — I10 Essential (primary) hypertension: Secondary | ICD-10-CM | POA: Diagnosis not present

## 2019-03-19 DIAGNOSIS — I739 Peripheral vascular disease, unspecified: Secondary | ICD-10-CM

## 2019-03-19 DIAGNOSIS — E785 Hyperlipidemia, unspecified: Secondary | ICD-10-CM

## 2019-03-19 DIAGNOSIS — I700 Atherosclerosis of aorta: Secondary | ICD-10-CM

## 2019-03-19 LAB — LIPID PANEL
Chol/HDL Ratio: 2.3 ratio (ref 0.0–4.4)
Cholesterol, Total: 127 mg/dL (ref 100–199)
HDL: 55 mg/dL (ref >39)
LDL Chol Calc (NIH): 58 mg/dL (ref 0–99)
Triglycerides: 67 mg/dL (ref 0–149)
VLDL Cholesterol Cal: 14 mg/dL (ref 5–40)

## 2019-03-19 NOTE — Patient Instructions (Signed)
Medication Instructions:  Your physician recommends that you continue on your current medications as directed. Please refer to the Current Medication list given to you today.  If you need a refill on your cardiac medications before your next appointment, please call your pharmacy.   Lab work: Your physician recommends that you return for lab work today: Lipids   If you have labs (blood work) drawn today and your tests are completely normal, you will receive your results only by: Marland Kitchen MyChart Message (if you have MyChart) OR . A paper copy in the mail If you have any lab test that is abnormal or we need to change your treatment, we will call you to review the results.  Testing/Procedures: None.   Follow-Up: At [facility], you and your health needs are our priority.  As part of our continuing mission to provide you with exceptional heart care, we have created designated Provider Care Teams.  These Care Teams include your primary Cardiologist (physician) and Advanced Practice Providers (APPs -  Physician Assistants and Nurse Practitioners) who all work together to provide you with the care you need, when you need it. You will need a follow up appointment in 5 months.  Please call our office 2 months in advance to schedule this appointment.  You may see Jenne Campus, MD or another member of our [place] Provider Team in [place]: Shirlee More, MD . Jyl Heinz, MD  Any Other Special Instructions Will Be Listed Below (If Applicable).  Dr. Agustin Cree has referred you to see Neurology they should call you within 1 week. If not please call our office.

## 2019-03-19 NOTE — Progress Notes (Signed)
Cardiology Office Note:    Date:  03/19/2019   ID:  Catherine Crawford, DOB 1965-07-25, MRN [id number]  PCP:  Patient, [name]  Cardiologist:  Jenne Campus, MD    Referring MD: No ref. provider found   Chief Complaint  Patient presents with  . Follow-up  Doing well cardiac wise  History of Present Illness:    Catherine Crawford is a 53 y.o. female with coronary disease, recent stress test done last year showed no evidence of ischemia, history of cardiomyopathy with last estimation of ejection fraction September of last year showing normal left ventricular ejection fraction, history of CVA.  Comes today to my office for follow-up she also did complete aortic occlusion distally.  She been followed by vascular surgery.  Overall she is doing well cardiac wise denies have any chest pain tightness squeezing pressure burning chest.  What concerns her is pain in the right shoulder is not related to exercise can last up to a couple hours and she said this is exactly the same symptomatology that she got when she got her stroke.  I think she need to be referred to neurology.  Denies having any chest pain, tightness, pressure, burning in the chest.  Still does have some claudication while walking.  Still  abstain from smoking however she described 1 situation when she was very upset and she almost smoke but was able to control herself.  Past Medical History:  Diagnosis Date  . AVD (aortic valve disease)   . CAD (coronary artery disease)   . Cardiomyopathy ([place])   . Cervical cancer ([place])   . CHF (congestive heart failure) ([place])   . COPD (chronic obstructive pulmonary disease) ([place])   . GERD (gastroesophageal reflux disease)   . Heart attack ([place])   . Hyperlipidemia   . Hypertension   . PAD (peripheral artery disease) ([place])   . Stroke ([place])   . Tricuspid valve disorder   . Varicosities of leg     Past Surgical History:  Procedure Laterality Date  . ABDOMINAL HYSTERECTOMY  2002  . CARDIAC  CATHETERIZATION    . CORONARY ANGIOPLASTY WITH STENT PLACEMENT    . HERNIA REPAIR  2004  . PERIPHERAL ARTERIAL STENT GRAFT      Current Medications: Current Meds  Medication Sig  . aspirin EC 81 MG tablet Take 81 mg by mouth daily.  . carvedilol (COREG) 3.125 MG tablet TAKE 1 TABLET(3.125 MG) BY MOUTH TWICE DAILY  . cilostazol (PLETAL) 100 MG tablet Take 1 tablet (100 mg total) by mouth 2 (two) times daily.  . clopidogrel (PLAVIX) 75 MG tablet Take 1 tablet (75 mg total) by mouth at bedtime.  Marland Kitchen ezetimibe (ZETIA) 10 MG tablet TAKE 1 TABLET(10 MG) BY MOUTH DAILY  . losartan (COZAAR) 100 MG tablet TAKE 1 TABLET(100 MG) BY MOUTH DAILY  . Magnesium 250 MG TABS Take 1 tablet by mouth daily.  . nitroGLYCERIN (NITROSTAT) 0.4 MG SL tablet Place 1 tablet (0.4 mg total) under the tongue every 5 (five) minutes as needed.  . pantoprazole (PROTONIX) 40 MG tablet TAKE 1 TABLET(40 MG) BY MOUTH DAILY  . rosuvastatin (CRESTOR) 40 MG tablet Take 1 tablet (40 mg total) by mouth daily.  Marland Kitchen spironolactone (ALDACTONE) 25 MG tablet TAKE 1/2 TABLET(12.5 MG) BY MOUTH TWICE DAILY     Allergies:   Morphine and related   Social History   Socioeconomic History  . Marital status: Married    Spouse name: Not on file  . Number  of children: Not on file  . Years of education: Not on file  . Highest education level: Not on file  Occupational History  . Not on file  Social Needs  . Financial resource strain: Not on file  . Food insecurity    Worry: Not on file    Inability: Not on file  . Transportation needs    Medical: Not on file    Non-medical: Not on file  Tobacco Use  . Smoking status: Former Smoker    Packs/day: 2.00    Years: 37.00    Pack years: 74.00    Types: Cigarettes    Quit date: 04/2017    Years since quitting: 1.9  . Smokeless tobacco: Never Used  Substance and Sexual Activity  . Alcohol use: Not Currently  . Drug use: Not Currently  . Sexual activity: Not on file  Lifestyle  .  Physical activity    Days per week: Not on file    Minutes per session: Not on file  . Stress: Not on file  Relationships  . Social Herbalist on phone: Not on file    Gets together: Not on file    Attends religious service: Not on file    Active member of club or organization: Not on file    Attends meetings of clubs or organizations: Not on file    Relationship status: Not on file  Other Topics Concern  . Not on file  Social History Narrative  . Not on file     Family History: The patient's family history includes AAA (abdominal aortic aneurysm) in her mother; CAD in her brother, father, and mother; Cancer in her brother, father, and mother; Heart attack in her father, mother, and sister; Heart disease in her brother, father, mother, and sister. ROS:   Please see the history of present illness.    All 14 point review of systems negative except as described per history of present illness  EKGs/Labs/Other Studies Reviewed:      Recent Labs: 07/17/2018: ALT 11  Recent Lipid Panel    Component Value Date/Time   CHOL 138 07/17/2018 0951   TRIG 69 07/17/2018 0951   HDL 59 07/17/2018 0951   CHOLHDL 2.3 07/17/2018 0951   LDLCALC 65 07/17/2018 0951    Physical Exam:    VS:  BP 118/64   Pulse 87   Ht 5\' 4"  (1.626 m)   Wt 227 lb (103 kg)   SpO2 95%   BMI 38.96 kg/m     Wt Readings from Last 3 Encounters:  03/19/19 227 lb (103 kg)  08/13/18 231 lb (104.8 kg)  07/17/18 231 lb (104.8 kg)     GEN:  Well nourished, well developed in no acute distress HEENT: Normal NECK: No JVD; No carotid bruits LYMPHATICS: No lymphadenopathy CARDIAC: RRR, no murmurs, no rubs, no gallops RESPIRATORY:  Clear to auscultation without rales, wheezing or rhonchi  ABDOMEN: Soft, non-tender, non-distended MUSCULOSKELETAL:  No edema; No deformity  SKIN: Warm and dry LOWER EXTREMITIES: no swelling NEUROLOGIC:  Alert and oriented x 3 PSYCHIATRIC:  Normal affect   ASSESSMENT:     1. Coronary artery disease involving native coronary artery of native heart without angina pectoris   2. Ischemic cardiomyopathy   3. Aortic occlusion (HCC)   4. Essential hypertension   5. Late effect of cerebrovascular accident (CVA)   6. Dyslipidemia   7. Claudication in peripheral vascular disease ([place])    PLAN:  In order of problems listed above:  1. Coronary disease last stress test done last year negative.  And now asymptomatic on aggressive therapy which I will continue. 2. History of ischemic cardiomyopathy but last echocardiogram from last year showed preserved left ventricular ejection fraction. 3. Abdominal aortic occlusion followed by vascular surgery. 4. Essential hypertension blood pressure well controlled continue present management. 5. Late effect of CVA I will schedule her to see a neurologist. 6. Dyslipidemia on Crestor 40 as well as Zetia which I will continue will do fasting lipid profile today. 7. Claudications.  I encouraged her to be active and exercise.   Medication Adjustments/Labs and Tests Ordered: Current medicines are reviewed at length with the patient today.  Concerns regarding medicines are outlined above.  No orders of the defined types were placed in this encounter.  Medication changes: No orders of the defined types were placed in this encounter.   Signed, Park Liter, MD, [facility] 03/19/2019 10:31 AM    [place]

## 2019-03-19 NOTE — Addendum Note (Signed)
Addended by: Ashok Norris on: 03/19/2019 10:45 AM   Modules accepted: Orders

## 2019-04-08 ENCOUNTER — Other Ambulatory Visit: Payer: Self-pay | Admitting: Cardiology

## 2019-04-21 NOTE — Progress Notes (Signed)
NEUROLOGY CONSULTATION NOTE  Catherine Crawford MRN: [id number] DOB: [birth year]  Referring provider: Jenne Campus, MD Primary care provider: No PCP  Reason for consult:  Cerebrovascular accident  HISTORY OF PRESENT ILLNESS: Catherine Crawford is a 53 year old right-handed white female with CAD, ischemic cardiomyopathy, CHF, COPD, hypertension, hyperlipidemia and peripheral artery disease who presents for CVA.  History supplemented by referring provider note.  She had a CVA and MI in 2018 while in [place].  She presented with a pain in the right side of her ribcage that radiated to the left side and caused dyspnea. She also had trouble using her right hand.  CVA thought to be due to her artery disease.  She still has residual tingling in the right arm, right side of neck and head.  She also has residual vision loss on the right.     More recently, she endorses right shoulder pain starting 2 months ago.  It starts in the back of her right shoulder radiating down the underside of her arm to the index and sometimes middle finger.  No new sensory deficits or weakness.  She denies neck pain.  She is concerned because she had similar symptoms associated with her CVA.  She has significant stroke risk factors.  She has history of CAD and cardiomyopathy.  Last stress test from last year showed no evidence of ischemia with normal left ventricular EF.  She has complete aortic occlusion distally, followed by vascular surgery.  At that time, carotid doppler showed no stenosis in either ICA but did demonstrate over 50% stenosis in right ECA, antegrade flow in both VAs.  LDL from last month was 58.  Current medications:  ASA 81mg ; Plavix 75mg ; Pletal; Coreg; Zetia 10mg ; Crestor 40mg ; Aldactone; Cozaar  PAST MEDICAL HISTORY: Past Medical History:  Diagnosis Date  . AVD (aortic valve disease)   . CAD (coronary artery disease)   . Cardiomyopathy ([place])   . Cervical cancer ([place])   . CHF (congestive heart  failure) ([place])   . COPD (chronic obstructive pulmonary disease) ([place])   . GERD (gastroesophageal reflux disease)   . Heart attack ([place])   . Hyperlipidemia   . Hypertension   . PAD (peripheral artery disease) ([place])   . Stroke ([place])   . Tricuspid valve disorder   . Varicosities of leg     PAST SURGICAL HISTORY: Past Surgical History:  Procedure Laterality Date  . ABDOMINAL HYSTERECTOMY  2002  . CARDIAC CATHETERIZATION    . CORONARY ANGIOPLASTY WITH STENT PLACEMENT    . HERNIA REPAIR  2004  . PERIPHERAL ARTERIAL STENT GRAFT      MEDICATIONS: Current Outpatient Medications on File Prior to Visit  Medication Sig Dispense Refill  . aspirin EC 81 MG tablet Take 81 mg by mouth daily.    . carvedilol (COREG) 3.125 MG tablet TAKE 1 TABLET(3.125 MG) BY MOUTH TWICE DAILY 180 tablet 0  . cilostazol (PLETAL) 100 MG tablet Take 1 tablet (100 mg total) by mouth 2 (two) times daily. 60 tablet 5  . clopidogrel (PLAVIX) 75 MG tablet Take 1 tablet (75 mg total) by mouth at bedtime. 90 tablet 3  . ezetimibe (ZETIA) 10 MG tablet TAKE 1 TABLET(10 MG) BY MOUTH DAILY 90 tablet 1  . losartan (COZAAR) 100 MG tablet TAKE 1 TABLET(100 MG) BY MOUTH DAILY 90 tablet 0  . Magnesium 250 MG TABS Take 1 tablet by mouth daily.    . nitroGLYCERIN (NITROSTAT) 0.4 MG SL tablet Place 1 tablet (0.4  mg total) under the tongue every 5 (five) minutes as needed. 30 tablet 3  . pantoprazole (PROTONIX) 40 MG tablet TAKE 1 TABLET(40 MG) BY MOUTH DAILY 90 tablet 0  . rosuvastatin (CRESTOR) 40 MG tablet Take 1 tablet (40 mg total) by mouth daily. 90 tablet 3  . spironolactone (ALDACTONE) 25 MG tablet TAKE 1/2 TABLET(12.5 MG) BY MOUTH TWICE DAILY 180 tablet 1   No current facility-administered medications on file prior to visit.     ALLERGIES: Allergies  Allergen Reactions  . Morphine And Related Nausea And Vomiting    FAMILY HISTORY: Family History  Problem Relation Age of Onset  . Heart attack Mother   . CAD Mother    . Cancer Mother   . Heart disease Mother   . AAA (abdominal aortic aneurysm) Mother   . Heart attack Father   . CAD Father   . Cancer Father   . Heart disease Father   . Heart attack Sister   . Heart disease Sister   . CAD Brother   . Cancer Brother   . Heart disease Brother    SOCIAL HISTORY: Social History   Socioeconomic History  . Marital status: Married    Spouse name: Not on file  . Number of children: Not on file  . Years of education: Not on file  . Highest education level: Not on file  Occupational History  . Not on file  Social Needs  . Financial resource strain: Not on file  . Food insecurity    Worry: Not on file    Inability: Not on file  . Transportation needs    Medical: Not on file    Non-medical: Not on file  Tobacco Use  . Smoking status: Former Smoker    Packs/day: 2.00    Years: 37.00    Pack years: 74.00    Types: Cigarettes    Quit date: 04/2017    Years since quitting: 2.0  . Smokeless tobacco: Never Used  Substance and Sexual Activity  . Alcohol use: Not Currently  . Drug use: Not Currently  . Sexual activity: Not on file  Lifestyle  . Physical activity    Days per week: Not on file    Minutes per session: Not on file  . Stress: Not on file  Relationships  . Social Herbalist on phone: Not on file    Gets together: Not on file    Attends religious service: Not on file    Active member of club or organization: Not on file    Attends meetings of clubs or organizations: Not on file    Relationship status: Not on file  . Intimate partner violence    Fear of current or ex partner: Not on file    Emotionally abused: Not on file    Physically abused: Not on file    Forced sexual activity: Not on file  Other Topics Concern  . Not on file  Social History Narrative  . Not on file    REVIEW OF SYSTEMS: Constitutional: No fevers, chills, or sweats, no generalized fatigue, change in appetite Eyes: No visual changes, double  vision, eye pain Ear, nose and throat: No hearing loss, ear pain, nasal congestion, sore throat Cardiovascular: No chest pain, palpitations Respiratory:  No shortness of breath at rest or with exertion, wheezes GastrointestinaI: No nausea, vomiting, diarrhea, abdominal pain, fecal incontinence Genitourinary:  No dysuria, urinary retention or frequency Musculoskeletal:  No neck pain, back pain  Integumentary: No rash, pruritus, skin lesions Neurological: as above Psychiatric: No depression, insomnia, anxiety Endocrine: No palpitations, fatigue, diaphoresis, mood swings, change in appetite, change in weight, increased thirst Hematologic/Lymphatic:  No purpura, petechiae. Allergic/Immunologic: no itchy/runny eyes, nasal congestion, recent allergic reactions, rashes  PHYSICAL EXAM: Blood pressure 120/77, pulse 91, temperature 98.4 F (36.9 C), height 5\' 4"  (1.626 m), weight 231 lb (104.8 kg), SpO2 94 %. General: No acute distress.  Patient appears well-groomed.   Head:  Normocephalic/atraumatic Eyes:  fundi examined but not visualized Neck: supple, no paraspinal tenderness, full range of motion Back: No paraspinal tenderness Heart: regular rate and rhythm Lungs: Clear to auscultation bilaterally. Vascular: No carotid bruits. Neurological Exam: Mental status: alert and oriented to person, place, and time, recent and remote memory intact, fund of knowledge intact, attention and concentration intact, speech fluent and not dysarthric, language intact. Cranial nerves: CN I: not tested CN II: pupils equal, round and reactive to light, right homonymous hemianopsia CN III, IV, VI:  full range of motion, no nystagmus, no ptosis CN V: mildly reduced right V1-V3 CN VII: upper and lower face symmetric CN VIII: hearing intact CN IX, X: gag intact, uvula midline CN XI: sternocleidomastoid and trapezius muscles intact CN XII: tongue midline Bulk & Tone: normal, no fasciculations. Motor:  5-/5  bilateral hip flexion and knee extension.  Otherwise, 5/5 Sensation:  Reduced pinprick sensation in right index and thumb, reduced vibratory sensation in right toe. Deep Tendon Reflexes:  2+ throughout,  toes downgoing.   Finger to nose testing:  Without dysmetria.   Heel to shin:  Without dysmetria.   Gait:  Normal station and stride.  Able to turn and tandem walk. Romberg negative.  IMPRESSION: 1.  Right upper extremity pain, likely radiculopathy.    2.  History of left hemispheric CVA with residual right homonymous hemianopsia and right sided paresthesias. 3.  Coronary artery disease 4.  Ischemic cardiomyopathy 5.  Essential hypertension 6.  Dyslipidemia 7.  Peripheral artery disease.  PLAN: 1.  Defers management for arm pain at this time (physical therapy, gabapentin).  She will contact me if she changes her mind. 2.  For secondary stroke prevention, continue:  ASA 81mg  and Plavix  Statin (LDL goal less than 70)  Blood pressure control  Mediterranean diet 3.  Follow up as needed.  Thank you for allowing me to take part in the care of this patient.  Metta Clines, DO  CC:  Jenne Campus, MD

## 2019-04-22 ENCOUNTER — Ambulatory Visit: Payer: Medicare Other | Admitting: Neurology

## 2019-04-22 ENCOUNTER — Other Ambulatory Visit: Payer: Self-pay

## 2019-04-22 ENCOUNTER — Encounter: Payer: Self-pay | Admitting: Neurology

## 2019-04-22 VITALS — BP 120/77 | HR 91 | Temp 98.4°F | Ht 64.0 in | Wt 231.0 lb

## 2019-04-22 DIAGNOSIS — H53461 Homonymous bilateral field defects, right side: Secondary | ICD-10-CM | POA: Diagnosis not present

## 2019-04-22 DIAGNOSIS — E785 Hyperlipidemia, unspecified: Secondary | ICD-10-CM

## 2019-04-22 DIAGNOSIS — I255 Ischemic cardiomyopathy: Secondary | ICD-10-CM

## 2019-04-22 DIAGNOSIS — I10 Essential (primary) hypertension: Secondary | ICD-10-CM

## 2019-04-22 DIAGNOSIS — M5412 Radiculopathy, cervical region: Secondary | ICD-10-CM | POA: Diagnosis not present

## 2019-04-22 DIAGNOSIS — I2510 Atherosclerotic heart disease of native coronary artery without angina pectoris: Secondary | ICD-10-CM

## 2019-04-22 DIAGNOSIS — I6930 Unspecified sequelae of cerebral infarction: Secondary | ICD-10-CM | POA: Diagnosis not present

## 2019-04-22 DIAGNOSIS — I739 Peripheral vascular disease, unspecified: Secondary | ICD-10-CM

## 2019-04-22 NOTE — Patient Instructions (Signed)
1.  I think the arm pain is likely a pinched nerve.  If you wish to pursue physical therapy or try a medication like gabapentin, contact me 2.  Continue your other medications for stroke prevention 3.  Follow up as needed.

## 2019-04-25 ENCOUNTER — Other Ambulatory Visit: Payer: Self-pay | Admitting: Cardiology

## 2019-04-28 NOTE — Telephone Encounter (Signed)
Rosuvastatin refill sent to [facility] [address] [place]

## 2019-05-04 ENCOUNTER — Other Ambulatory Visit: Payer: Self-pay | Admitting: Vascular Surgery

## 2019-05-04 DIAGNOSIS — I739 Peripheral vascular disease, unspecified: Secondary | ICD-10-CM

## 2019-05-12 ENCOUNTER — Other Ambulatory Visit: Payer: Self-pay | Admitting: Cardiology

## 2019-07-11 ENCOUNTER — Other Ambulatory Visit: Payer: Self-pay | Admitting: Cardiology

## 2019-07-11 DIAGNOSIS — I255 Ischemic cardiomyopathy: Secondary | ICD-10-CM

## 2019-07-11 DIAGNOSIS — E785 Hyperlipidemia, unspecified: Secondary | ICD-10-CM

## 2019-07-11 DIAGNOSIS — I2510 Atherosclerotic heart disease of native coronary artery without angina pectoris: Secondary | ICD-10-CM

## 2019-07-14 ENCOUNTER — Other Ambulatory Visit: Payer: Self-pay | Admitting: Cardiology

## 2019-07-14 DIAGNOSIS — E785 Hyperlipidemia, unspecified: Secondary | ICD-10-CM

## 2019-07-14 DIAGNOSIS — I255 Ischemic cardiomyopathy: Secondary | ICD-10-CM

## 2019-07-14 DIAGNOSIS — I2510 Atherosclerotic heart disease of native coronary artery without angina pectoris: Secondary | ICD-10-CM

## 2019-07-22 ENCOUNTER — Other Ambulatory Visit: Payer: Self-pay | Admitting: Cardiology

## 2019-08-01 NOTE — Progress Notes (Signed)
Handicap form signed and mailed to patient.

## 2019-08-10 ENCOUNTER — Other Ambulatory Visit: Payer: Self-pay | Admitting: Cardiology

## 2019-08-10 DIAGNOSIS — E785 Hyperlipidemia, unspecified: Secondary | ICD-10-CM

## 2019-08-10 DIAGNOSIS — I255 Ischemic cardiomyopathy: Secondary | ICD-10-CM

## 2019-08-10 DIAGNOSIS — I2510 Atherosclerotic heart disease of native coronary artery without angina pectoris: Secondary | ICD-10-CM

## 2019-08-11 ENCOUNTER — Other Ambulatory Visit: Payer: Self-pay | Admitting: Cardiology

## 2019-08-11 DIAGNOSIS — I255 Ischemic cardiomyopathy: Secondary | ICD-10-CM

## 2019-08-11 DIAGNOSIS — I2510 Atherosclerotic heart disease of native coronary artery without angina pectoris: Secondary | ICD-10-CM

## 2019-08-11 DIAGNOSIS — E785 Hyperlipidemia, unspecified: Secondary | ICD-10-CM

## 2019-08-13 ENCOUNTER — Other Ambulatory Visit: Payer: Self-pay

## 2019-08-13 ENCOUNTER — Encounter: Payer: Self-pay | Admitting: Cardiology

## 2019-08-13 ENCOUNTER — Ambulatory Visit (INDEPENDENT_AMBULATORY_CARE_PROVIDER_SITE_OTHER): Payer: Medicare Other | Admitting: Cardiology

## 2019-08-13 VITALS — BP 126/82 | HR 75 | Ht 64.0 in | Wt 223.0 lb

## 2019-08-13 DIAGNOSIS — I2510 Atherosclerotic heart disease of native coronary artery without angina pectoris: Secondary | ICD-10-CM | POA: Diagnosis not present

## 2019-08-13 DIAGNOSIS — I10 Essential (primary) hypertension: Secondary | ICD-10-CM

## 2019-08-13 DIAGNOSIS — I255 Ischemic cardiomyopathy: Secondary | ICD-10-CM

## 2019-08-13 DIAGNOSIS — I700 Atherosclerosis of aorta: Secondary | ICD-10-CM

## 2019-08-13 DIAGNOSIS — I6930 Unspecified sequelae of cerebral infarction: Secondary | ICD-10-CM

## 2019-08-13 DIAGNOSIS — I739 Peripheral vascular disease, unspecified: Secondary | ICD-10-CM | POA: Diagnosis not present

## 2019-08-13 NOTE — Patient Instructions (Signed)
Medication Instructions:  Your physician recommends that you continue on your current medications as directed. Please refer to the Current Medication list given to you today.  *If you need a refill on your cardiac medications before your next appointment, please call your pharmacy*  Lab Work: Your physician recommends that you return for lab work today: lipid   If you have labs (blood work) drawn today and your tests are completely normal, you will receive your results only by: Marland Kitchen MyChart Message (if you have MyChart) OR . A paper copy in the mail If you have any lab test that is abnormal or we need to change your treatment, we will call you to review the results.  Testing/Procedures: Your physician has requested that you have an echocardiogram. Echocardiography is a painless test that uses sound waves to create images of your heart. It provides your doctor with information about the size and shape of your heart and how well your heart's chambers and valves are working. This procedure takes approximately one hour. There are no restrictions for this procedure.  Your physician has requested that you have a carotid duplex. This test is an ultrasound of the carotid arteries in your neck. It looks at blood flow through these arteries that supply the brain with blood. Allow one hour for this exam. There are no restrictions or special instructions.    Follow-Up: At [facility], you and your health needs are our priority.  As part of our continuing mission to provide you with exceptional heart care, we have created designated Provider Care Teams.  These Care Teams include your primary Cardiologist (physician) and Advanced Practice Providers (APPs -  Physician Assistants and Nurse Practitioners) who all work together to provide you with the care you need, when you need it.  Your next appointment:   4 month(s)  The format for your next appointment:   In Person  Provider:   Jenne Campus,  MD  Other Instructions   Echocardiogram An echocardiogram is a procedure that uses painless sound waves (ultrasound) to produce an image of the heart. Images from an echocardiogram can provide important information about:  Signs of coronary artery disease (CAD).  Aneurysm detection. An aneurysm is a weak or damaged part of an artery wall that bulges out from the normal force of blood pumping through the body.  Heart size and shape. Changes in the size or shape of the heart can be associated with certain conditions, including heart failure, aneurysm, and CAD.  Heart muscle function.  Heart valve function.  Signs of a past heart attack.  Fluid buildup around the heart.  Thickening of the heart muscle.  A tumor or infectious growth around the heart valves. Tell a health care provider about:  Any allergies you have.  All medicines you are taking, including vitamins, herbs, eye drops, creams, and over-the-counter medicines.  Any blood disorders you have.  Any surgeries you have had.  Any medical conditions you have.  Whether you are pregnant or may be pregnant. What are the risks? Generally, this is a safe procedure. However, problems may occur, including:  Allergic reaction to dye (contrast) that may be used during the procedure. What happens before the procedure? No specific preparation is needed. You may eat and drink normally. What happens during the procedure?   An IV tube may be inserted into one of your veins.  You may receive contrast through this tube. A contrast is an injection that improves the quality of the pictures from  your heart.  A gel will be applied to your chest.  A wand-like tool (transducer) will be moved over your chest. The gel will help to transmit the sound waves from the transducer.  The sound waves will harmlessly bounce off of your heart to allow the heart images to be captured in real-time motion. The images will be recorded on a  computer. The procedure may vary among health care providers and hospitals. What happens after the procedure?  You may return to your normal, everyday life, including diet, activities, and medicines, unless your health care provider tells you not to do that. Summary  An echocardiogram is a procedure that uses painless sound waves (ultrasound) to produce an image of the heart.  Images from an echocardiogram can provide important information about the size and shape of your heart, heart muscle function, heart valve function, and fluid buildup around your heart.  You do not need to do anything to prepare before this procedure. You may eat and drink normally.  After the echocardiogram is completed, you may return to your normal, everyday life, unless your health care provider tells you not to do that. This information is not intended to replace advice given to you by your health care provider. Make sure you discuss any questions you have with your health care provider. Document Revised: 10/17/2018 Document Reviewed: 07/29/2016 Elsevier Patient Education  Catherine Crawford.

## 2019-08-13 NOTE — Progress Notes (Signed)
Cardiology Office Note:    Date:  08/13/2019   ID:  Catherine Crawford, DOB 12-21-1965, MRN [id number]  PCP:  Patient, [name]  Cardiologist:  Jenne Campus, MD    Referring MD: No ref. provider found   Chief Complaint  Patient presents with  . Follow-up    5 MO FU     History of Present Illness:    Catherine Crawford is a 54 y.o. female with past medical history significant for advanced atherosclerosis.  She did have myocardial infarction in 2018, also completely occluded distal abdominal aorta with claudication, extracardiac arterial disease, remote history of cardiomyopathy with diminished ejection fraction, however, latest echocardiogram showed normal left ventricle ejection fraction.  She is being seen by a vascular surgeon for potentially doing aortofemoral grafts, however she was felt to be very high risk and that is why surgery has not been done.   She comes today to my office for follow-up overall doing quite well.  Obviously claudication still a problem.  She said when she goes from front to the back of [employer] tissue usually have to stop one time.  She did not notice any improvement in the distance that she is able to walk.  Recently she started keto diet with intention to lose weight and she can succeeded so far losing about 13 pounds she feels better because of that.  We spent a great deal of time talking about this and I asked her to modified his diet instead of red meat I advised her to eat either fish or chicken.  We also talk about need to eat more vegetables.  She also does some intermittent fasting she does not take her breakfast. Denies have any cardiac complaint, there is no chest pain, no tightness, no pressure no burning in the chest.  Past Medical History:  Diagnosis Date  . AVD (aortic valve disease)   . CAD (coronary artery disease)   . Cardiomyopathy ([place])   . Cervical cancer ([place])   . CHF (congestive heart failure) ([place])   . COPD (chronic obstructive pulmonary  disease) ([place])   . GERD (gastroesophageal reflux disease)   . Heart attack ([place])   . Hyperlipidemia   . Hypertension   . PAD (peripheral artery disease) ([place])   . Stroke ([place])   . Tricuspid valve disorder   . Varicosities of leg     Past Surgical History:  Procedure Laterality Date  . ABDOMINAL HYSTERECTOMY  2002  . CARDIAC CATHETERIZATION    . CORONARY ANGIOPLASTY WITH STENT PLACEMENT    . HERNIA REPAIR  2004  . PERIPHERAL ARTERIAL STENT GRAFT      Current Medications: Current Meds  Medication Sig  . aspirin EC 81 MG tablet Take 81 mg by mouth daily.  . carvedilol (COREG) 3.125 MG tablet TAKE 1 TABLET(3.125 MG) BY MOUTH TWICE DAILY  . cilostazol (PLETAL) 100 MG tablet TAKE 1 TABLET(100 MG) BY MOUTH TWICE DAILY  . clopidogrel (PLAVIX) 75 MG tablet Take 1 tablet (75 mg total) by mouth daily.  Marland Kitchen ezetimibe (ZETIA) 10 MG tablet TAKE 1 TABLET(10 MG) BY MOUTH DAILY  . losartan (COZAAR) 100 MG tablet TAKE 1 TABLET(100 MG) BY MOUTH DAILY  . Magnesium 250 MG TABS Take 1 tablet by mouth daily.  . nitroGLYCERIN (NITROSTAT) 0.4 MG SL tablet Place 1 tablet (0.4 mg total) under the tongue every 5 (five) minutes as needed.  . pantoprazole (PROTONIX) 40 MG tablet TAKE 1 TABLET(40 MG) BY MOUTH DAILY  . rosuvastatin (CRESTOR)  40 MG tablet TAKE 1 TABLET BY MOUTH DAILY  . spironolactone (ALDACTONE) 25 MG tablet TAKE 1/2 TABLET(12.5 MG) BY MOUTH TWICE DAILY     Allergies:   Morphine and related   Social History   Socioeconomic History  . Marital status: Married    Spouse name: Not on file  . Number of children: Not on file  . Years of education: Not on file  . Highest education level: Not on file  Occupational History  . Not on file  Tobacco Use  . Smoking status: Former Smoker    Packs/day: 2.00    Years: 37.00    Pack years: 74.00    Types: Cigarettes    Quit date: 04/2017    Years since quitting: 2.3  . Smokeless tobacco: Never Used  Substance and Sexual Activity  . Alcohol  use: Not Currently  . Drug use: Not Currently  . Sexual activity: Not on file  Other Topics Concern  . Not on file  Social History Narrative   One level with husband      Right handed      Caffeine none      Exercise - not really      Education 12th grade   Social Determinants of Health   Financial Resource Strain:   . Difficulty of Paying Living Expenses: Not on file  Food Insecurity:   . Worried About Charity fundraiser in the Last Year: Not on file  . Ran Out of Food in the Last Year: Not on file  Transportation Needs:   . Lack of Transportation (Medical): Not on file  . Lack of Transportation (Non-Medical): Not on file  Physical Activity:   . Days of Exercise per Week: Not on file  . Minutes of Exercise per Session: Not on file  Stress:   . Feeling of Stress : Not on file  Social Connections:   . Frequency of Communication with Friends and Family: Not on file  . Frequency of Social Gatherings with Friends and Family: Not on file  . Attends Religious Services: Not on file  . Active Member of Clubs or Organizations: Not on file  . Attends Archivist Meetings: Not on file  . Marital Status: Not on file     Family History: The patient's family history includes AAA (abdominal aortic aneurysm) in her mother; CAD in her brother, father, and mother; Cancer in her brother, father, and mother; Heart attack in her father, mother, and sister; Heart disease in her brother, father, mother, and sister. ROS:   Please see the history of present illness.    All 14 point review of systems negative except as described per history of present illness  EKGs/Labs/Other Studies Reviewed:      Recent Labs: No results found for requested labs within last 8760 hours.  Recent Lipid Panel    Component Value Date/Time   CHOL 127 03/19/2019 1050   TRIG 67 03/19/2019 1050   HDL 55 03/19/2019 1050   CHOLHDL 2.3 03/19/2019 1050   LDLCALC 58 03/19/2019 1050    Physical Exam:     VS:  BP 126/82   Pulse 75   Ht 5\' 4"  (1.626 m)   Wt 223 lb (101.2 kg)   BMI 38.28 kg/m     Wt Readings from Last 3 Encounters:  08/13/19 223 lb (101.2 kg)  04/22/19 231 lb (104.8 kg)  03/19/19 227 lb (103 kg)     GEN:  Well nourished, well developed  in no acute distress HEENT: Normal NECK: No JVD; No carotid bruits LYMPHATICS: No lymphadenopathy CARDIAC: RRR, no murmurs, no rubs, no gallops RESPIRATORY:  Clear to auscultation without rales, wheezing or rhonchi  ABDOMEN: Soft, non-tender, non-distended MUSCULOSKELETAL:  No edema; No deformity  SKIN: Warm and dry LOWER EXTREMITIES: no swelling NEUROLOGIC:  Alert and oriented x 3 PSYCHIATRIC:  Normal affect   ASSESSMENT:    1. Coronary artery disease involving native coronary artery of native heart without angina pectoris   2. Ischemic cardiomyopathy   3. Essential hypertension   4. Aortic occlusion (HCC)   5. Claudication in peripheral vascular disease ([place])   6. Late effect of cerebrovascular accident (CVA)    PLAN:    In order of problems listed above:  1. Coronary disease stable from that point review on appropriate medication which I will continue.  That include dual antiplatelets therapy which I will continue indefinitely.  She is also on Crestor high intensity statin. 2. Ischemic cardiomyopathy echocardiogram will be repeated. 3. Essential hypertension blood pressure excellently controlled continue present management. 4. Distal abdominal aortic occlusion.  Conservative approach.  Felt to be high risk for surgery. 5. Claudications encouraged her to exercise on the regular basis.  She is already on Pletal. 6. Late effect of CVA stable no new problems.   Medication Adjustments/Labs and Tests Ordered: Current medicines are reviewed at length with the patient today.  Concerns regarding medicines are outlined above.  No orders of the defined types were placed in this encounter.  Medication changes: No orders of  the defined types were placed in this encounter.   Signed, Park Liter, MD, [facility] 08/13/2019 3:45 PM    [place]

## 2019-08-13 NOTE — Addendum Note (Signed)
Addended by: Ashok Norris on: 08/13/2019 04:10 PM   Modules accepted: Orders

## 2019-09-01 ENCOUNTER — Other Ambulatory Visit: Payer: Self-pay | Admitting: Cardiology

## 2019-09-30 ENCOUNTER — Other Ambulatory Visit: Payer: Self-pay

## 2019-09-30 ENCOUNTER — Other Ambulatory Visit: Payer: Self-pay | Admitting: Cardiology

## 2019-09-30 ENCOUNTER — Ambulatory Visit: Payer: Medicare Other

## 2019-09-30 ENCOUNTER — Ambulatory Visit (INDEPENDENT_AMBULATORY_CARE_PROVIDER_SITE_OTHER): Payer: Medicare Other

## 2019-09-30 DIAGNOSIS — I255 Ischemic cardiomyopathy: Secondary | ICD-10-CM | POA: Diagnosis not present

## 2019-09-30 DIAGNOSIS — I2510 Atherosclerotic heart disease of native coronary artery without angina pectoris: Secondary | ICD-10-CM

## 2019-09-30 DIAGNOSIS — Z8673 Personal history of transient ischemic attack (TIA), and cerebral infarction without residual deficits: Secondary | ICD-10-CM | POA: Diagnosis not present

## 2019-09-30 DIAGNOSIS — I7090 Unspecified atherosclerosis: Secondary | ICD-10-CM

## 2019-09-30 NOTE — Progress Notes (Unsigned)
Echocardiogram performed.  Jimmy Izek Corvino RDCS, RVT

## 2019-10-01 LAB — LIPID PANEL
Chol/HDL Ratio: 2.2 ratio (ref 0.0–4.4)
Cholesterol, Total: 108 mg/dL (ref 100–199)
HDL: 49 mg/dL (ref >39)
LDL Chol Calc (NIH): 46 mg/dL (ref 0–99)
Triglycerides: 60 mg/dL (ref 0–149)
VLDL Cholesterol Cal: 13 mg/dL (ref 5–40)

## 2019-10-17 ENCOUNTER — Telehealth: Payer: Self-pay | Admitting: Cardiology

## 2019-10-17 NOTE — Telephone Encounter (Signed)
Spoke with patient now in regards to this concern. Dr Agustin Cree has recommended that she sees her primary care doctor for this issue. Patient states that she will do this and verbalizes understanding.    Encouraged patient to call back with any questions or concerns.

## 2019-10-17 NOTE — Telephone Encounter (Signed)
Routing to Dr. Bing Neighbors for further advise.

## 2019-10-17 NOTE — Telephone Encounter (Signed)
New Message    Pt is calling and says for the last 2 weeks she has been having blood appear in the toilet. She says she has it after a bowel movement and after urinating. She says she has had no menstrual cycles since she was 33 and has had a hysterectomy at 35. She thinks something is going on internally     Please call

## 2019-10-31 ENCOUNTER — Other Ambulatory Visit: Payer: Self-pay | Admitting: Vascular Surgery

## 2019-10-31 DIAGNOSIS — I739 Peripheral vascular disease, unspecified: Secondary | ICD-10-CM

## 2019-11-08 ENCOUNTER — Other Ambulatory Visit: Payer: Self-pay | Admitting: Cardiology

## 2019-11-12 DIAGNOSIS — J039 Acute tonsillitis, unspecified: Secondary | ICD-10-CM | POA: Diagnosis not present

## 2019-11-12 DIAGNOSIS — J0100 Acute maxillary sinusitis, unspecified: Secondary | ICD-10-CM | POA: Diagnosis not present

## 2019-12-11 ENCOUNTER — Ambulatory Visit: Payer: Medicare Other | Admitting: Cardiology

## 2019-12-11 ENCOUNTER — Encounter: Payer: Self-pay | Admitting: Gastroenterology

## 2019-12-11 ENCOUNTER — Encounter: Payer: Self-pay | Admitting: Cardiology

## 2019-12-11 ENCOUNTER — Other Ambulatory Visit: Payer: Self-pay

## 2019-12-11 VITALS — BP 120/68 | HR 102 | Ht 64.0 in | Wt 213.0 lb

## 2019-12-11 DIAGNOSIS — I255 Ischemic cardiomyopathy: Secondary | ICD-10-CM

## 2019-12-11 DIAGNOSIS — I700 Atherosclerosis of aorta: Secondary | ICD-10-CM | POA: Diagnosis not present

## 2019-12-11 DIAGNOSIS — I10 Essential (primary) hypertension: Secondary | ICD-10-CM | POA: Diagnosis not present

## 2019-12-11 DIAGNOSIS — I2510 Atherosclerotic heart disease of native coronary artery without angina pectoris: Secondary | ICD-10-CM

## 2019-12-11 DIAGNOSIS — E785 Hyperlipidemia, unspecified: Secondary | ICD-10-CM

## 2019-12-11 DIAGNOSIS — K469 Unspecified abdominal hernia without obstruction or gangrene: Secondary | ICD-10-CM

## 2019-12-11 NOTE — Progress Notes (Signed)
Cardiology Office Note:    Date:  12/11/2019   ID:  Catherine Crawford, DOB 07-23-1965, MRN [id number]  PCP:  Patient, [name]  Cardiologist:  Jenne Campus, MD    Referring MD: No ref. provider found   Chief Complaint  Patient presents with  . Follow-up  Doing very well but still have some claudication  History of Present Illness:    Catherine Crawford is a 54 y.o. female with past medical history significant for advanced premature coronary artery disease and atherosclerosis.  She did have myocardial infarction in 2018, she also did completely occluded distal abdominal aorta with claudication.  She does have history of cardiomyopathy however with normalization based on latest echocardiogram.  She comes today to my office for follow-up.  She did talk to vascular surgeon about potentially having surgery on her abdominal arctic however she was disqualified as candidate because of high risk surgery.  She seems to be doing fine she try to walk however she does have a fit bit and she does about 2000 steps a day.  She said when she walks to [facility] from front to the back she has to stop typically about 1 time.  This is not worse is not progressing however does not get any better either.  She worsened her weight and she lost significant mount of which she is very proud of herself and also disappointed because she is not losing more.  Denies have any cardiac complaint, no chest pain tightness squeezing pressure burning chest.  She complained of having some pain in the right lower portion of her abdomen.  She said when she coughs sometimes it hurts and sometimes she can feel a small nodule, she did have an incisional hernia before and she said it feels exactly the same way.  She want me to refer her to a surgeon for possible hernia repair. Past Medical History:  Diagnosis Date  . Aortic occlusion ([place]) 04/16/2018  . AVD (aortic valve disease)   . CAD (coronary artery disease)   . Cardiomyopathy ([place])     . Cervical cancer ([place])   . CHF (congestive heart failure) ([place])   . Claudication in peripheral vascular disease ([place]) 02/19/2018   Completely occluded aorta distal to renal arteries  . COPD (chronic obstructive pulmonary disease) ([place])   . Coronary artery disease 02/19/2018   Status post PTCA and stenting of LAD with a 3/30 millimeters stent, stents to RCA 2.5/30 8 mm stent in October 2018 in the face of acute myocardial infarction  . Dyslipidemia 02/19/2018  . Essential hypertension 02/19/2018  . GERD (gastroesophageal reflux disease)   . Heart attack ([place])   . Hyperlipidemia   . Hypertension   . Late effect of cerebrovascular accident (CVA) 02/19/2018   With right side numbness as well as peripheral vision loss  . PAD (peripheral artery disease) ([place])   . Stroke ([place])   . Tricuspid valve disorder   . Varicosities of leg     Past Surgical History:  Procedure Laterality Date  . ABDOMINAL HYSTERECTOMY  2002  . CARDIAC CATHETERIZATION    . CORONARY ANGIOPLASTY WITH STENT PLACEMENT    . HERNIA REPAIR  2004  . PERIPHERAL ARTERIAL STENT GRAFT      Current Medications: Current Meds  Medication Sig  . aspirin EC 81 MG tablet Take 81 mg by mouth daily.  . carvedilol (COREG) 3.125 MG tablet TAKE 1 TABLET(3.125 MG) BY MOUTH TWICE DAILY  . cilostazol (PLETAL) 100 MG tablet TAKE 1  TABLET BY MOUTH TWICE DAILY  . clopidogrel (PLAVIX) 75 MG tablet Take 75 mg by mouth daily.  . Cyanocobalamin (VITAMIN B-12 PO) Take by mouth.  . ezetimibe (ZETIA) 10 MG tablet TAKE 1 TABLET(10 MG) BY MOUTH DAILY  . losartan (COZAAR) 100 MG tablet TAKE 1 TABLET(100 MG) BY MOUTH DAILY  . Magnesium 250 MG TABS Take 1 tablet by mouth daily.  Marland Kitchen MAGNESIUM PO Take by mouth.  . nitroGLYCERIN (NITROSTAT) 0.4 MG SL tablet Place 1 tablet (0.4 mg total) under the tongue every 5 (five) minutes as needed.  . pantoprazole (PROTONIX) 40 MG tablet TAKE 1 TABLET(40 MG) BY MOUTH DAILY  . rosuvastatin (CRESTOR) 40 MG tablet TAKE  1 TABLET BY MOUTH DAILY  . spironolactone (ALDACTONE) 25 MG tablet TAKE ONE-HALF TABLET BY MOUTH TWICE DAILY     Allergies:   Morphine and related   Social History   Socioeconomic History  . Marital status: Married    Spouse name: Not on file  . Number of children: Not on file  . Years of education: Not on file  . Highest education level: Not on file  Occupational History  . Not on file  Tobacco Use  . Smoking status: Former Smoker    Packs/day: 2.00    Years: 37.00    Pack years: 74.00    Types: Cigarettes    Quit date: 04/2017    Years since quitting: 2.6  . Smokeless tobacco: Never Used  Substance and Sexual Activity  . Alcohol use: Not Currently  . Drug use: Not Currently  . Sexual activity: Not on file  Other Topics Concern  . Not on file  Social History Narrative   One level with husband      Right handed      Caffeine none      Exercise - not really      Education 12th grade   Social Determinants of Health   Financial Resource Strain:   . Difficulty of Paying Living Expenses:   Food Insecurity:   . Worried About Charity fundraiser in the Last Year:   . Arboriculturist in the Last Year:   Transportation Needs:   . Film/video editor (Medical):   Marland Kitchen Lack of Transportation (Non-Medical):   Physical Activity:   . Days of Exercise per Week:   . Minutes of Exercise per Session:   Stress:   . Feeling of Stress :   Social Connections:   . Frequency of Communication with Friends and Family:   . Frequency of Social Gatherings with Friends and Family:   . Attends Religious Services:   . Active Member of Clubs or Organizations:   . Attends Archivist Meetings:   Marland Kitchen Marital Status:      Family History: The patient's family history includes AAA (abdominal aortic aneurysm) in her mother; CAD in her brother, father, and mother; Cancer in her brother, father, and mother; Heart attack in her father, mother, and sister; Heart disease in her brother,  father, mother, and sister. ROS:   Please see the history of present illness.    All 14 point review of systems negative except as described per history of present illness  EKGs/Labs/Other Studies Reviewed:      Recent Labs: No results found for requested labs within last 8760 hours.  Recent Lipid Panel    Component Value Date/Time   CHOL 108 09/30/2019 1447   TRIG 60 09/30/2019 1447   HDL 49 09/30/2019  1447   CHOLHDL 2.2 09/30/2019 1447   LDLCALC 46 09/30/2019 1447    Physical Exam:    VS:  BP 120/68   Pulse (!) 102   Ht 5\' 4"  (1.626 m)   Wt 213 lb (96.6 kg)   SpO2 97%   BMI 36.56 kg/m     Wt Readings from Last 3 Encounters:  12/11/19 213 lb (96.6 kg)  08/13/19 223 lb (101.2 kg)  04/22/19 231 lb (104.8 kg)     GEN:  Well nourished, well developed in no acute distress HEENT: Normal NECK: No JVD; No carotid bruits LYMPHATICS: No lymphadenopathy CARDIAC: RRR, no murmurs, no rubs, no gallops RESPIRATORY:  Clear to auscultation without rales, wheezing or rhonchi  ABDOMEN: Soft, non-tender, non-distended MUSCULOSKELETAL:  No edema; No deformity  SKIN: Warm and dry LOWER EXTREMITIES: no swelling NEUROLOGIC:  Alert and oriented x 3 PSYCHIATRIC:  Normal affect   ASSESSMENT:    1. Coronary artery disease involving native coronary artery of native heart without angina pectoris   2. Ischemic cardiomyopathy   3. Aortic occlusion (HCC)   4. Essential hypertension   5. Dyslipidemia    PLAN:    In order of problems listed above:  1. Coronary disease stable from that point view.  Last stress test done in 2019 at the end of the year showed no evidence of ischemia.  She is on aggressive medical management which include antiplatelets therapy as well as aggressive cholesterol management.  Luckily she does not have new symptoms. 2. History of ischemic cardiomyopathy with normalization and appropriate medication which I will continue.  Last echocardiogram done in March of  this year showing preserved left ventricle ejection fraction. 3. Distal abdominal artery occlusion with some claudication disqualified as candidate for surgery by surgeons, will continue encouraged to exercise on a regular basis.  She is taking Pletal which I will continue for now. 4. Essential hypertension blood pressure well controlled continue present management. 5. Dyslipidemia: She is on Zetia as well as high intense statin.  I will recheck her fasting lipid profile now when she is on keto diet that we already discussed and asked her to cut down red meat she thinks more fish as well as chicken. 6. Potentially abdominal wall hernia.  She will be referred to surgery.   Medication Adjustments/Labs and Tests Ordered: Current medicines are reviewed at length with the patient today.  Concerns regarding medicines are outlined above.  No orders of the defined types were placed in this encounter.  Medication changes: No orders of the defined types were placed in this encounter.   Signed, Park Liter, MD, [facility] 12/11/2019 8:50 AM    [place]

## 2019-12-11 NOTE — Patient Instructions (Signed)
Medication Instructions:  Your physician recommends that you continue on your current medications as directed. Please refer to the Current Medication list given to you today.  *If you need a refill on your cardiac medications before your next appointment, please call your pharmacy*   Lab Work: Your physician recommends that you return for lab work in 1 month: lipid   If you have labs (blood work) drawn today and your tests are completely normal, you will receive your results only by: Marland Kitchen MyChart Message (if you have MyChart) OR . A paper copy in the mail If you have any lab test that is abnormal or we need to change your treatment, we will call you to review the results.   Testing/Procedures: None.    Follow-Up: At [facility], you and your health needs are our priority.  As part of our continuing mission to provide you with exceptional heart care, we have created designated Provider Care Teams.  These Care Teams include your primary Cardiologist (physician) and Advanced Practice Providers (APPs -  Physician Assistants and Nurse Practitioners) who all work together to provide you with the care you need, when you need it.  We recommend signing up for the patient portal called "MyChart".  Sign up information is provided on this After Visit Summary.  MyChart is used to connect with patients for Virtual Visits (Telemedicine).  Patients are able to view lab/test results, encounter notes, upcoming appointments, etc.  Non-urgent messages can be sent to your provider as well.   To learn more about what you can do with MyChart, go to [email or web address].    Your next appointment:   5 month(s)  The format for your next appointment:   In Person  Provider:   Jenne Campus, MD   Other Instructions  Dr. Agustin Cree referred you to gastroenterology for your hernia they should call you within one week if not please call our office.

## 2020-01-01 ENCOUNTER — Other Ambulatory Visit: Payer: Self-pay | Admitting: Cardiology

## 2020-01-27 ENCOUNTER — Telehealth: Payer: Self-pay | Admitting: Gastroenterology

## 2020-01-27 ENCOUNTER — Ambulatory Visit (INDEPENDENT_AMBULATORY_CARE_PROVIDER_SITE_OTHER): Payer: Medicare Other | Admitting: Gastroenterology

## 2020-01-27 ENCOUNTER — Encounter: Payer: Self-pay | Admitting: Gastroenterology

## 2020-01-27 ENCOUNTER — Telehealth: Payer: Self-pay

## 2020-01-27 ENCOUNTER — Other Ambulatory Visit (INDEPENDENT_AMBULATORY_CARE_PROVIDER_SITE_OTHER): Payer: Medicare Other

## 2020-01-27 DIAGNOSIS — K625 Hemorrhage of anus and rectum: Secondary | ICD-10-CM

## 2020-01-27 DIAGNOSIS — R1011 Right upper quadrant pain: Secondary | ICD-10-CM | POA: Diagnosis not present

## 2020-01-27 LAB — CBC
HCT: 37.7 % (ref 36.0–46.0)
Hemoglobin: 12.9 g/dL (ref 12.0–15.0)
MCHC: 34.1 g/dL (ref 30.0–36.0)
MCV: 90.1 fl (ref 78.0–100.0)
Platelets: 226 10*3/uL (ref 150.0–400.0)
RBC: 4.18 Mil/uL (ref 3.87–5.11)
RDW: 14.1 % (ref 11.5–15.5)
WBC: 7.5 10*3/uL (ref 4.0–10.5)

## 2020-01-27 LAB — COMPREHENSIVE METABOLIC PANEL
ALT: 13 U/L (ref 0–35)
AST: 14 U/L (ref 0–37)
Albumin: 4.3 g/dL (ref 3.5–5.2)
Alkaline Phosphatase: 62 U/L (ref 39–117)
BUN: 20 mg/dL (ref 6–23)
CO2: 27 mEq/L (ref 19–32)
Calcium: 9.5 mg/dL (ref 8.4–10.5)
Chloride: 106 mEq/L (ref 96–112)
Creatinine, Ser: 1.08 mg/dL (ref 0.40–1.20)
GFR: 52.4 mL/min — ABNORMAL LOW (ref >60.00)
Glucose, Bld: 98 mg/dL (ref 70–99)
Potassium: 4 mEq/L (ref 3.5–5.1)
Sodium: 140 mEq/L (ref 135–145)
Total Bilirubin: 0.4 mg/dL (ref 0.2–1.2)
Total Protein: 7.3 g/dL (ref 6.0–8.3)

## 2020-01-27 MED ORDER — SUPREP BOWEL PREP KIT 17.5-3.13-1.6 GM/177ML PO SOLN
1.0000 | ORAL | 0 refills | Status: DC
Start: 2020-01-27 — End: 2021-09-08

## 2020-01-27 NOTE — Telephone Encounter (Signed)
[facility] Pre-operative Risk Assessment     Request for surgical clearance:     Endoscopy Procedure  What type of surgery is being performed?     Colonoscopy  When is this surgery scheduled?     03-31-20  What type of clearance is required ?   Pharmacy  Are there any medications that need to be held prior to surgery and how long? Plavix and Pletal x 5 days  Practice name and name of physician performing surgery?      [facility]  What is your office phone and fax number?      Phone- [phone]  [phone]  Anesthesia type (None, local, MAC, general) ?       MAC

## 2020-01-27 NOTE — Telephone Encounter (Signed)
Returned call to patient and advised her that CT has been rescheduled for 02-04-20 at 10:00am. Patient advised to arrive at 9:45am, NPO 4 hours prior, and she should drink one bottle of contrast at 8:00am and one bottle at 9:00am.  Patient agreed to plan and verbalized understanding.  No further questions.

## 2020-01-27 NOTE — Progress Notes (Signed)
HPI: This is a very pleasant 54 year old woman   who was referred to me by Park Liter, MD  to evaluate abdominal pain, rectal bleeding.    She had a hysterectomy about 15 years ago complicated by abdominal wall hernias.  About 2 or 3 years after the hysterectomy she underwent hernia surgery.  She tells me the mesh was placed during surgery.  She also tells me she had a hiatal hernia.  I do not think that the hiatal hernia was repaired at the same time as her abdominal wall hernias however.  She feels she might have another hernia.  Right mid abdomen she has intermittent bulging.  It will be tender to touch.  The pain can last 20 to 30 minutes and improves when the bulge goes down.  She does not generally manually reduce it because it hurts quite a lot.  She does not have nausea or vomiting around the same time.  She also has nearly daily rectal bleeding.  She says it is a lot of blood.  She never really has to push or strain to move her bowels.  She was having some anal discomfort several months ago and treated this with topical over-the-counter medicines and that has mainly improved.  She has never had colon cancer screening  Colon cancer does not run in her family  She is on blood thinners Plavix as well as Pletal.  This is for significant arterial vascular disease including coronary artery disease as well as peripheral vascular disease.  She sees a cardiologist routinely.  She had a echocardiogram 4 months ago and it showed normal left ventricular ejection fraction.    Review of systems: Pertinent positive and negative review of systems were noted in the above HPI section. All other review negative.   Past Medical History:  Diagnosis Date  . Aortic occlusion ([place]) 04/16/2018  . AVD (aortic valve disease)   . CAD (coronary artery disease)   . Cardiomyopathy ([place])   . Cervical cancer ([place])   . CHF (congestive heart failure) ([place])   . Claudication in peripheral vascular disease  ([place]) 02/19/2018   Completely occluded aorta distal to renal arteries  . COPD (chronic obstructive pulmonary disease) ([place])   . Coronary artery disease 02/19/2018   Status post PTCA and stenting of LAD with a 3/30 millimeters stent, stents to RCA 2.5/30 8 mm stent in October 2018 in the face of acute myocardial infarction  . Depression   . Dyslipidemia 02/19/2018  . Essential hypertension 02/19/2018  . GERD (gastroesophageal reflux disease)   . Heart attack ([place])   . Hyperlipidemia   . Hypertension   . Late effect of cerebrovascular accident (CVA) 02/19/2018   With right side numbness as well as peripheral vision loss  . PAD (peripheral artery disease) ([place])   . Stroke ([place])   . Tricuspid valve disorder   . Varicosities of leg     Past Surgical History:  Procedure Laterality Date  . ABDOMINAL HYSTERECTOMY  2002  . CARDIAC CATHETERIZATION    . CESAREAN SECTION    . CORONARY ANGIOPLASTY WITH STENT PLACEMENT    . HERNIA REPAIR  2004   hiatal hernia, double lower abdominal hernia  . PERIPHERAL ARTERIAL STENT GRAFT     x 2    Current Outpatient Medications  Medication Sig Dispense Refill  . Ascorbic Acid (VITAMIN C) 500 MG CAPS Take 1 capsule by mouth daily.    Marland Kitchen aspirin EC 81 MG tablet Take 81 mg by  mouth daily.    . carvedilol (COREG) 3.125 MG tablet TAKE 1 TABLET(3.125 MG) BY MOUTH TWICE DAILY 180 tablet 2  . cilostazol (PLETAL) 100 MG tablet TAKE 1 TABLET BY MOUTH TWICE DAILY 60 tablet 5  . clopidogrel (PLAVIX) 75 MG tablet Take 75 mg by mouth daily.    . Cyanocobalamin (VITAMIN B-12 PO) Take 1,000 mcg by mouth daily.     Marland Kitchen ezetimibe (ZETIA) 10 MG tablet TAKE 1 TABLET(10 MG) BY MOUTH DAILY 90 tablet 2  . losartan (COZAAR) 100 MG tablet TAKE 1 TABLET(100 MG) BY MOUTH DAILY 90 tablet 2  . Magnesium 250 MG TABS Take 1 tablet by mouth daily.    . Misc Natural Products (GREEN TEA) TABS Take 1 tablet by mouth daily.    . pantoprazole (PROTONIX) 40 MG tablet TAKE 1 TABLET(40 MG) BY  MOUTH DAILY 90 tablet 3  . rosuvastatin (CRESTOR) 40 MG tablet TAKE 1 TABLET BY MOUTH DAILY 90 tablet 3  . spironolactone (ALDACTONE) 25 MG tablet TAKE ONE-HALF TABLET BY MOUTH TWICE DAILY 90 tablet 2  . nitroGLYCERIN (NITROSTAT) 0.4 MG SL tablet Place 1 tablet (0.4 mg total) under the tongue every 5 (five) minutes as needed. (Patient not taking: Reported on 01/27/2020) 30 tablet 3   No current facility-administered medications for this visit.    Allergies as of 01/27/2020 - Review Complete 01/27/2020  Allergen Reaction Noted  . Morphine and related Nausea And Vomiting 02/18/2018    Family History  Problem Relation Age of Onset  . Heart attack Mother   . CAD Mother   . Heart disease Mother   . AAA (abdominal aortic aneurysm) Mother   . Lung cancer Mother   . Heart attack Father   . CAD Father   . Heart disease Father   . Liver cancer Father        mets to lung  . Heart attack Sister 27  . Heart disease Sister   . CAD Brother   . Heart disease Brother   . Brain cancer Brother   . Bone cancer Maternal Aunt   . Uterine cancer Maternal Aunt   . Cervical cancer Maternal Aunt   . Skin cancer Maternal Aunt   . Breast cancer Maternal Aunt   . Cancer Maternal Aunt        spinal     Social History   Socioeconomic History  . Marital status: Married    Spouse name: Not on file  . Number of children: 2  . Years of education: Not on file  . Highest education level: Not on file  Occupational History  . Occupation: disabled  Tobacco Use  . Smoking status: Former Smoker    Packs/day: 2.00    Years: 37.00    Pack years: 74.00    Types: Cigarettes    Quit date: 04/13/2017    Years since quitting: 2.7  . Smokeless tobacco: Never Used  Vaping Use  . Vaping Use: Never used  Substance and Sexual Activity  . Alcohol use: Not Currently  . Drug use: Not Currently  . Sexual activity: Not on file  Other Topics Concern  . Not on file  Social History Narrative   One level with  husband      Right handed      Caffeine none      Exercise - not really      Education 12th grade   Social Determinants of Health   Financial Resource Strain:   . Difficulty  of Paying Living Expenses:   Food Insecurity:   . Worried About Charity fundraiser in the Last Year:   . Arboriculturist in the Last Year:   Transportation Needs:   . Film/video editor (Medical):   Marland Kitchen Lack of Transportation (Non-Medical):   Physical Activity:   . Days of Exercise per Week:   . Minutes of Exercise per Session:   Stress:   . Feeling of Stress :   Social Connections:   . Frequency of Communication with Friends and Family:   . Frequency of Social Gatherings with Friends and Family:   . Attends Religious Services:   . Active Member of Clubs or Organizations:   . Attends Archivist Meetings:   Marland Kitchen Marital Status:   Intimate Partner Violence:   . Fear of Current or Ex-Partner:   . Emotionally Abused:   Marland Kitchen Physically Abused:   . Sexually Abused:      Physical Exam: BP 120/70 (BP Location: Left Arm, Patient Position: Sitting, Cuff Size: Normal)   Pulse 88   Ht 5' 3.25" (1.607 m) Comment: height measured without shoes  Wt 218 lb (98.9 kg)   BMI 38.31 kg/m  Constitutional: generally well-appearing Psychiatric: alert and oriented x3 Eyes: extraocular movements intact Mouth: oral pharynx moist, no lesions Neck: supple no lymphadenopathy Cardiovascular: heart regular rate and rhythm Lungs: clear to auscultation bilaterally Abdomen: soft, tender in right mid abdomen, mildly.  I cannot palpate a clear abdominal wall hernia at the site but it is tender when I probe manually., nondistended, no obvious ascites, no peritoneal signs, normal bowel sounds Extremities: no lower extremity edema bilaterally Skin: no lesions on visible extremities Rectal exam with female assistant in the room: No anal fissures, there is small blue blood at the anus that looks like it is possibly a  vascular malformation or small hemorrhoid, no digital rectal masses, stool was brown and not checked for Hemoccult  Assessment and plan: 54 y.o. female with right-sided abdominal pain, history of abdominal hernias, rectal bleeding  First I think it is certainly possible and probably even likely that she does have an abdominal wall hernia.  She has had these in the past.  I recommended a CT scan to see if we can confirm that because I could not palpate an obvious defect on examination.  CT scan would also help to rule out other potential causes of her right-sided abdominal pain.  I will likely be referring her to [facility] surgery pending the results of the CT scan.  I also recommended a colonoscopy given her daily rectal bleeding and she will have basic set of labs including CBC and complete metabolic profile.  She is on Plavix and Pletal.  I recommended that she hold those both for 5 days prior to a colonoscopy and we will reach out to her cardiologist to make sure that he is okay with that recommendation.    Please see the "Patient Instructions" section for addition details about the plan.   Owens Loffler, MD [facility] 01/27/2020, 2:33 PM  Cc: Park Liter, MD  Total time on date of encounter was 46  minutes (this included time spent preparing to see the patient reviewing records; obtaining and/or reviewing separately obtained history; performing a medically appropriate exam and/or evaluation; counseling and educating the patient and family if present; ordering medications, tests or procedures if applicable; and documenting clinical information in the health record).

## 2020-01-27 NOTE — Patient Instructions (Addendum)
If you are age 56 or older, your body mass index should be between 23-30. Your Body mass index is 38.31 kg/m. If this is out of the aforementioned range listed, please consider follow up with your Primary Care Provider.  If you are age 35 or younger, your body mass index should be between 19-25. Your Body mass index is 38.31 kg/m. If this is out of the aformentioned range listed, please consider follow up with your Primary Care Provider.   You have been scheduled for a CT scan of the abdomen and pelvis at [facility], 1st floor Radiology. You are scheduled on 02/03/20  at 10:30am. You should arrive 15 minutes prior to your appointment time for registration. Please follow the written instructions below on the day of your exam:    1) Do not eat anything after 6:30am (4 hours prior to your test)  2)  At least 3 days prior to your procedure you will need to pick up 2 bottles of oral contrast to drink.   The solution may taste better if refrigerated, but do NOT add ice or any other liquid to this solution. Shake well before drinking.   Drink 1 bottle of contrast @ 8:30am (2 hours prior to your exam)  Drink 1 bottle of contrast @ 9:30am (1 hour prior to your exam)   You may take any medications as prescribed with a small amount of water, if necessary. If you take any of the following medications: METFORMIN, GLUCOPHAGE, GLUCOVANCE, AVANDAMET, RIOMET, FORTAMET, [place] MET, JANUMET, GLUMETZA or METAGLIP, you MAY be asked to HOLD this medication 48 hours AFTER the exam.   The purpose of you drinking the oral contrast is to aid in the visualization of your intestinal tract. The contrast solution may cause some diarrhea. Depending on your individual set of symptoms, you may also receive an intravenous injection of x-ray contrast/dye. Plan on being at [facility] for 45 minutes or longer, depending on the type of exam you are having performed.   If you have any questions regarding your exam or if you need  to reschedule, you may call [facility] at [phone] between the hours of 8:00 am and 5:00 pm, Monday-Friday.     __________________________________________________________  Your provider has requested that you go to the basement level for lab work before leaving today. Press "B" on the elevator. The lab is located at the first door on the left as you exit the elevator.  You have been scheduled for a colonoscopy. Please follow written instructions given to you at your visit today.  Please pick up your prep supplies at the pharmacy within the next 1-3 days. If you use inhalers (even only as needed), please bring them with you on the day of your procedure.  Due to recent changes in healthcare laws, you may see the results of your imaging and laboratory studies on MyChart before your provider has had a chance to review them.  We understand that in some cases there may be results that are confusing or concerning to you. Not all laboratory results come back in the same time frame and the provider may be waiting for multiple results in order to interpret others.  Please give [place] 48 hours in order for your provider to thoroughly review all the results before contacting the office for clarification of your results.   You will be contacted by our office prior to your procedure for directions on holding your Plavix and Pletal.  If you do  not hear from our office 1 week prior to your scheduled procedure, please call [phone] to discuss.   Thank you for entrusting me with your care and choosing [facility].  Dr Ardis Hughs

## 2020-01-28 NOTE — Telephone Encounter (Signed)
° °  Primary Cardiologist: Jenne Campus, MD  Chart reviewed as part of pre-operative protocol coverage. Given past medical history and time since last visit, based on ACC/AHA guidelines, Catherine Crawford would be at acceptable risk for the planned procedure without further cardiovascular testing.   Her Plavix may be held for 5 days prior to her procedure and her Pletal may be held for 2 days prior to procedure.  Please resume as soon as hemostasis is achieved.  I will route this recommendation to the requesting party via Epic fax function and remove from pre-op pool.  Please call with questions.  Jossie Ng. Renan Danese NP-C    01/28/2020, 11:01 AM [address] Office [phone] Fax [phone]

## 2020-01-28 NOTE — Telephone Encounter (Signed)
Pletal can be held for 2 days prior to procedure

## 2020-01-28 NOTE — Telephone Encounter (Signed)
Should be fine to stop Plavix for 5 days before procedure obviously there is some risk of it but overall I think risk acceptable.  Monitor exactly why we will stop Pletal.  There should be no problem stopping this medication as well.

## 2020-01-29 NOTE — Telephone Encounter (Signed)
Patient advised OK to hold Plavix 5 days prior to colonoscopy and OK to hold Pletal 2 days prior to colonoscopy scheduled for 03-31-20 per Dr Agustin Cree.  Patient agreed to plan and verbalized understanding. No further questions.

## 2020-01-29 NOTE — Telephone Encounter (Signed)
Left message for patient to return call regarding instructions on Plavix and Pletal hold. Will continue efforts.

## 2020-02-03 ENCOUNTER — Ambulatory Visit (HOSPITAL_COMMUNITY): Payer: Medicare Other

## 2020-02-04 ENCOUNTER — Encounter (HOSPITAL_COMMUNITY): Payer: Self-pay

## 2020-02-04 ENCOUNTER — Telehealth: Payer: Self-pay | Admitting: Cardiology

## 2020-02-04 ENCOUNTER — Other Ambulatory Visit: Payer: Self-pay

## 2020-02-04 ENCOUNTER — Ambulatory Visit (HOSPITAL_COMMUNITY)
Admission: RE | Admit: 2020-02-04 | Discharge: 2020-02-04 | Disposition: A | Discharge status: Home / Self Care | Payer: Medicare Other | Source: Ambulatory Visit | Attending: Gastroenterology | Admitting: Gastroenterology

## 2020-02-04 DIAGNOSIS — K439 Ventral hernia without obstruction or gangrene: Secondary | ICD-10-CM | POA: Diagnosis not present

## 2020-02-04 DIAGNOSIS — R1011 Right upper quadrant pain: Secondary | ICD-10-CM | POA: Insufficient documentation

## 2020-02-04 DIAGNOSIS — K429 Umbilical hernia without obstruction or gangrene: Secondary | ICD-10-CM | POA: Diagnosis not present

## 2020-02-04 DIAGNOSIS — K8020 Calculus of gallbladder without cholecystitis without obstruction: Secondary | ICD-10-CM | POA: Diagnosis not present

## 2020-02-04 DIAGNOSIS — K469 Unspecified abdominal hernia without obstruction or gangrene: Secondary | ICD-10-CM | POA: Diagnosis not present

## 2020-02-04 MED ORDER — SODIUM CHLORIDE (PF) 0.9 % IJ SOLN
INTRAMUSCULAR | Status: AC
  Filled 2020-02-04: qty 50

## 2020-02-04 MED ORDER — IOHEXOL 300 MG/ML  SOLN
100.0000 mL | Freq: Once | INTRAMUSCULAR | Status: AC | PRN
  Administered 2020-02-04: 100 mL via INTRAVENOUS

## 2020-02-04 NOTE — Telephone Encounter (Signed)
Pt called and need to get a disability placard filled out by Dr. Agustin Cree. Pt was able to fill out her part and needs to know when she can drop it off so Dr. Agustin Cree can fill out the other part. Please call to discuss.

## 2020-02-04 NOTE — Telephone Encounter (Signed)
Spoke with the patient just now and let her know that she could bring this paperwork in at any time and that it would be given to Dr. Wendy Poet nurse to be filled out. She requests that this be done for the 5 year placard instead of the 6 months. I let her know that I would route this to [facility] so that she was aware and could watch for the paperwork.

## 2020-02-12 ENCOUNTER — Telehealth: Payer: Self-pay | Admitting: Gastroenterology

## 2020-02-12 NOTE — Telephone Encounter (Signed)
The pt has been given the information to the pt and advised her that she will be getting a call from the surgeons office. The pt has been advised of the information and verbalized understanding.

## 2020-02-12 NOTE — Telephone Encounter (Signed)
Catherine Crawford, The CT scan shows you do probably have a hernia near your umbilicus (bellybutton). This might be what is causing you difficulties. You also have gallstones in your gallbladder and certainly those can cause right upper quadrant pains however not usually associated with a bulging in the abdomen. I think a general surgery consultation is very appropriate here in my office is going to arrange that for you.  Still continue to plan for that colonoscopy, it looks like it is scheduled for September sometime. I will see you then.

## 2020-02-16 ENCOUNTER — Telehealth: Payer: Self-pay | Admitting: Cardiology

## 2020-02-16 NOTE — Telephone Encounter (Signed)
Patient calling back to check on status of paperwork.

## 2020-02-16 NOTE — Telephone Encounter (Signed)
Transferred call to [place] 

## 2020-02-16 NOTE — Telephone Encounter (Signed)
Left message for patient to return call.

## 2020-02-16 NOTE — Telephone Encounter (Signed)
Patient called back and was informed that Dr. Agustin Cree has filled the form out and it is ready for her in [place] office.

## 2020-02-19 ENCOUNTER — Telehealth: Payer: Self-pay | Admitting: Gastroenterology

## 2020-02-19 NOTE — Telephone Encounter (Signed)
I spoke with the pt she tells me that she has not heard from [place].  I have called [facility] and spoke with [place] and she cannot find the referral that was placed on 7/30  [facility] RE: Catherine Crawford 12-06-2063   02/06/2020 11:03 AM [name] [name], RN [facility] AMB CHART REVIEW DEMOGRAPHICS     Cover Page Message : abdominal wall hernia, gallstones, right-sided abdominal pains      [facility] RE: [name] 12-[birth year]63   02/06/2020 11:03 AM [name], RN CT ABDOMEN PELVIS W CONTRAST ([id number])     Cover Page Message : Referral for abdominal wall hernia, gallstones, right-sided abdominal pains       I have refaxed and she will call the pt with appt

## 2020-02-23 ENCOUNTER — Telehealth: Payer: Self-pay | Admitting: Cardiology

## 2020-02-23 NOTE — Telephone Encounter (Signed)
Left message to call back  

## 2020-02-23 NOTE — Telephone Encounter (Signed)
Spoke to the patient just now and let her know that Dr. Agustin Cree does not know that Dr. She verbalizes understanding and thanks me for the call back.

## 2020-02-23 NOTE — Telephone Encounter (Signed)
I do not know anything about his doctor.  Sorry.

## 2020-02-23 NOTE — Telephone Encounter (Signed)
Patient was referred to another Vascular Surgeon (Dr. Kenn File) in [place], [place] at [facility]. The patient wanted to know if Dr. Raliegh Ip knew anything about this particular doctor. She trusts Dr. Raliegh Ip and his opinion and would like to know more about this doctor before her upcoming appointment.

## 2020-02-24 ENCOUNTER — Telehealth: Payer: Self-pay

## 2020-02-24 NOTE — Telephone Encounter (Signed)
Received fax from [facility] stating that patient will be seen by Dr Redmond Pulling on 03/17/20 at 10:30am.

## 2020-03-17 ENCOUNTER — Ambulatory Visit: Payer: Self-pay | Admitting: General Surgery

## 2020-03-17 DIAGNOSIS — Z7902 Long term (current) use of antithrombotics/antiplatelets: Secondary | ICD-10-CM | POA: Diagnosis not present

## 2020-03-17 DIAGNOSIS — K432 Incisional hernia without obstruction or gangrene: Secondary | ICD-10-CM | POA: Diagnosis not present

## 2020-03-17 DIAGNOSIS — K8020 Calculus of gallbladder without cholecystitis without obstruction: Secondary | ICD-10-CM | POA: Diagnosis not present

## 2020-03-17 DIAGNOSIS — Z955 Presence of coronary angioplasty implant and graft: Secondary | ICD-10-CM | POA: Diagnosis not present

## 2020-03-17 DIAGNOSIS — I739 Peripheral vascular disease, unspecified: Secondary | ICD-10-CM | POA: Diagnosis not present

## 2020-03-17 NOTE — H&P (Signed)
Annamaria Boots Appointment: 03/17/2020 10:30 AM Location: [facility] Patient #: [id number] DOB: 11-05-1965 Married / Language: Cleophus Molt / Race: White Female  History of Present Illness Randall Hiss M. Harveen Flesch MD; 03/17/2020 10:57 AM) The patient is a 54 year old female who presents with abdominal pain. She is referred by Dr Ardis Hughs for evaluation of gallstones and potential incisional hernia. She complains of stabbing right upper quadrant pain. She states that it feels like something is pushing out. This pain lasted about 1-5 minutes. It will occur randomly. She specifically points to her right upper quadrant away from her umbilical area. She also complains of intermittent pain on her right side underneath her right rib cage. She states this will radiate to her back. It generally last about 30 minutes. This is been going on for years. She initially thought it was muscle spasms. If she lays on that right side it'll help decrease the pain. No nausea, vomiting, or bloating. Daily bowel movement. She does have some rectal bleeding. She was scheduled for colonoscopy since she hasn't had one but canceled it because she wanted to get her abdominal pain and vascular issues sorted out first. She has significant coronary artery disease and PAD history. She had multiple cardiac stents as well as multiple pelvic stents placed. It sounds like some of her pelvic stents are occluded. She is having some claudication. But no rest pain. She states she can walk a few 100 yards before getting pain in her calves. She is on Plavix. She had a hysterectomy and developed a hernia. She states that she underwent hernia repair with mesh which was performed through 2 small incisions all to the side. They did not go back through her lower midline.  She denies any chest pain, chest pressure, shortness of breath. She denies any TIAs or amaurosis fugax. She does endorse some dyspnea on exertion  I reviewed GI  medicine's note, I reviewed her echocardiogram. Also reviewed her CT scan. She has what appears to be eventration of the mesh of the umbilicus. The defect of the umbilicus measures about 5 cm wide by about 4 cm vertical. The mesh extends down the lower midline to about 5 cm above the pubis. The abdominal wall is mostly intact in the lower midline. She has metal tacks. On the CT the also read hepatomegaly a, laxity in the abdominal wall. By iliac stent graft   Problem List/Past Medical Randall Hiss M. Redmond Pulling, MD; 03/17/2020 10:57 AM) Fatima Blank HERNIA, WITHOUT OBSTRUCTION OR GANGRENE (K43.2) ANTIPLATELET OR ANTITHROMBOTIC LONG-TERM USE (Z79.02) HISTORY OF HEART ARTERY STENT (Z95.5) SYMPTOMATIC CHOLELITHIASIS (K80.20) This patient encounter took 47 minutes today to perform the following: take history, perform exam, review outside records, interpret imaging, counsel the patient on their diagnosis and document encounter, findings & plan in the EHR RECTAL BLEEDING (K62.5) PAD (PERIPHERAL ARTERY DISEASE) (I73.9)  Diagnostic Studies History Darden Palmer, RMA; 03/17/2020 10:03 AM) Colonoscopy never  Allergies Darden Palmer, RMA; 03/17/2020 10:04 AM) Beckey Rutter ER *ANALGESICS - OPIOID* Nausea. Allergies Reconciled  Medication History Darden Palmer, [place]; 03/17/2020 10:08 AM) Pantoprazole Sodium (40MG  Tablet DR, Oral) Active. Clopidogrel Bisulfate (75MG  Tablet, Oral) Active. Carvedilol (3.125MG  Tablet, Oral) Active. Ezetimibe (10MG  Tablet, Oral) Active. Spironolactone (25MG  Tablet, Oral) Active. Cilostazol (100MG  Tablet, Oral) Active. Rosuvastatin Calcium (40MG  Tablet, Oral) Active. Cyanocobalamin (1000MCG Capsule, Oral) Active. Nitroglycerin (0.4MG  Tab Sublingual, Sublingual) Active. Aspirin (81MG  Tablet Chewable, Oral) Active. Vitamin C (500MG  Capsule, Oral) Active. B-12 Resin (1000MCG Tablet ER, Oral) Active. Medications Reconciled  Social History Lattie Haw [place],  RMA;  07/10/9145 82:95 AM) Illicit drug use Remotely quit drug use.  Family History Darden Palmer, [place]; 03/17/2020 10:03 AM) Cancer Family Members In General.  Pregnancy / Birth History Darden Palmer, [place]; 03/17/2020 10:03 AM) Age at menarche 60 years. Age of menopause <43 Para 2  Other Problems Randall Hiss M. Redmond Pulling, MD; 03/17/2020 10:57 AM) Arthritis Back Pain Cerebrovascular Accident Cervical Cancer Congestive Heart Failure Hemorrhoids Hypercholesterolemia Myocardial infarction Other disease, cancer, significant illness Vascular Disease     Review of Systems Darden Palmer RMA; 03/17/2020 10:03 AM) General Not Present- Appetite Loss, Chills, Fatigue, Fever, Night Sweats, Weight Gain and Weight Loss.  Vitals Lattie Haw Caldwell RMA; 03/17/2020 10:09 AM) 03/17/2020 10:09 AM Weight: 216.5 lb Height: 63in Body Surface Area: 2 m Body Mass Index: 38.35 kg/m  Temp.: 97.52F  Pulse: 115 (Regular)  P.OX: 93% (Room air) BP: 120/72(Sitting, Left Arm, Standard)        Physical Exam Randall Hiss M. Larosa Rhines MD; 03/17/2020 10:54 AM)  The physical exam findings are as follows: Note:obese, central truncal  General Mental Status-Alert. General Appearance-Consistent with stated age. Hydration-Well hydrated. Voice-Normal.  Head and Neck Head-normocephalic, atraumatic with no lesions or palpable masses. Trachea-midline. Thyroid Gland Characteristics - normal size and consistency.  Eye Eyeball - Bilateral-Normal. Sclera/Conjunctiva - Bilateral-No scleral icterus.  Chest and Lung Exam Chest and lung exam reveals -quiet, even and easy respiratory effort with no use of accessory muscles and on auscultation, normal breath sounds, no adventitious sounds and normal vocal resonance. Inspection Chest Wall - Normal. Back - normal.  Breast - Did not examine.  Cardiovascular Cardiovascular examination reveals -normal heart sounds, regular rate and rhythm with no  murmurs and normal pedal pulses bilaterally.  Abdomen Inspection Inspection of the abdomen reveals - No Hernias. Skin - Scar - Note: well healed trocar scars; lower midline scar. palpable knot at umbilicus. Palpation/Percussion Palpation and Percussion of the abdomen reveal - Soft, Non Tender, No Rebound tenderness, No Rigidity (guarding) and No hepatosplenomegaly. Auscultation Auscultation of the abdomen reveals - Bowel sounds normal.  Peripheral Vascular Upper Extremity Palpation - Pulses bilaterally normal.  Neurologic Neurologic evaluation reveals -alert and oriented x 3 with no impairment of recent or remote memory. Mental Status-Normal.  Neuropsychiatric The patient's mood and affect are described as -normal. Judgment and Insight-insight is appropriate concerning matters relevant to self.  Musculoskeletal Normal Exam - Left-Upper Extremity Strength Normal and Lower Extremity Strength Normal. Normal Exam - Right-Upper Extremity Strength Normal and Lower Extremity Strength Normal.  Lymphatic Head & Neck  General Head & Neck Lymphatics: Bilateral - Description - Normal. Axillary - Did not examine. Femoral & Inguinal - Did not examine.    Assessment & Plan Randall Hiss M. Hannan Tetzlaff MD; 03/17/2020 10:53 AM)  SYMPTOMATIC CHOLELITHIASIS (K80.20) Story: This patient encounter took 47 minutes today to perform the following: take history, perform exam, review outside records, interpret imaging, counsel the patient on their diagnosis and document encounter, findings & plan in the EHR Impression: I believe the patient's symptoms are consistent with gallbladder disease.  We discussed gallbladder disease. The patient was given Neurosurgeon. We discussed non-operative and operative management. We discussed the signs & symptoms of acute cholecystitis  I discussed laparoscopic cholecystectomy with IOC in detail. The patient was given educational material as well as diagrams  detailing the procedure. We discussed the risks and benefits of a laparoscopic cholecystectomy including, but not limited to bleeding, infection, injury to surrounding structures such as the intestine or liver, bile leak, retained gallstones, need to convert  to an open procedure, prolonged diarrhea, blood clots such as DVT, common bile duct injury, anesthesia risks, and possible need for additional procedures. We discussed the typical post-operative recovery course. I explained that the likelihood of improvement of their symptoms is good.  The patient has elected to proceed with surgery.  we will need to cardiac clearance given her CAD hx and PAD hx and permission to hold plavix  I did explain that she is at slightly increased risk for enterotomy given her prior abdominal surgery but it seems like her right upper quadrant is free. We will not bring the gallbladder out through the umbilical area given her mesh and hernia in that location.  Current Plans Pt Education - Pamphlet Given - Laparoscopic Gallbladder Surgery: discussed with patient and provided information. I recommended obtaining preoperative cardiac clearance. I am concerned about the health of the patient and the ability to tolerate the operation. Therefore, we will request clearance by cardiology to better assess operative risk & see if a reevaluation, further workup, etc is needed. Also recommendations on how medications such as for anticoagulation and blood pressure should be managed/held/restarted after surgery.  INCISIONAL HERNIA, WITHOUT OBSTRUCTION OR GANGRENE (K43.2) Impression: She does have a recurrent incisional hernia but the defect is mainly around the umbilicus. It is not where she is pointing at. She is pointing at her right upper quadrant and right side. I asked her this in multiple ways. She has eventration of her mesh through her umbilicus defect. So therefore I don't think she is necessary symptomatic with respect to her  recurrent incisional hernia. She does have a large piece of mesh from her umbilicus down to about 5 cm above her pubic bone with metal tacks. It sounds like he was a laparoscopic incisional hernia repair with mesh and metal tacks. I did try to explain her what her vascular surgeon was explaining with the risk of going through that area such as enterotomy.   PAD (PERIPHERAL ARTERY DISEASE) (I73.9) Impression: We did explain that she might be at increased risk for perioperative cardiac events given her severe atherosclerotic disease. We will have cardiology weight and as well as permission to hold her Plavix   HISTORY OF HEART ARTERY STENT (Z95.5)   ANTIPLATELET OR ANTITHROMBOTIC LONG-TERM USE (Z79.02)   RECTAL BLEEDING (K62.5) Impression: She states that she had postponed/canceled her colonoscopy since she wanted to focus on her abdominal pain and vascular issue. I strongly encouraged her to contact her gastroenterologist to get rescheduled for colonoscopy. We also discussed the importance of colonic surveillance.  I also discussed the importance of getting an up-to-date mammogram  Leighton Ruff. Redmond Pulling, MD, FACS General, Bariatric, & Minimally Invasive Surgery [facility], [place]

## 2020-03-18 ENCOUNTER — Telehealth: Payer: Self-pay | Admitting: Emergency Medicine

## 2020-03-18 NOTE — Telephone Encounter (Signed)
Left message to call back for clearance.  Ok to hold Plavix and Pletal per Dr Agustin Cree according to recent colonoscopy clearance.   Kerin Ransom PA-C 03/18/2020 2:35 PM

## 2020-03-18 NOTE — Telephone Encounter (Signed)
° °  [facility] Pre-operative Risk Assessment    HEARTCARE STAFF: - Please ensure there is not already an duplicate clearance open for this procedure. - Under Visit Info/Reason for Call, type in Other and utilize the format Clearance MM/DD/YY or Clearance TBD. Do not use dashes or single digits. - If request is for dental extraction, please clarify the # of teeth to be extracted.  Request for surgical clearance:  1. What type of surgery is being performed? gallbladder surgery    2. When is this surgery scheduled? TBD   3. What type of clearance is required (medical clearance vs. Pharmacy clearance to hold med vs. Both)? both  4. Are there any medications that need to be held prior to surgery and how long? plavix    5. Practice name and name of physician performing surgery? [facility], Greer Pickerel, MD     6. What is the office phone number? [phone]   7.   What is the office fax number? [phone]  8.   Anesthesia type (None, local, MAC, general) ? General    Catherine Crawford Catherine Crawford 03/18/2020, 10:41 AM  _________________________________________________________________   (provider comments below)

## 2020-03-18 NOTE — Telephone Encounter (Signed)
Follow up   Pt is calling back   Please call

## 2020-03-18 NOTE — Telephone Encounter (Signed)
   Primary Cardiologist: Jenne Campus, MD  Chart reviewed and patient contacted by phone today as part of pre-operative protocol coverage. Given past medical history and time since last visit, based on ACC/AHA guidelines, Mckaylah Bettendorf would be at acceptable risk for the planned procedure without further cardiovascular testing.   OK to hold cilostazol 2 days pre op and clopidogrel 5 days pre op. OK to hold aspirin 81 mg 3-5 days pre op if needed.    The patient was advised that if she develops new symptoms prior to surgery to contact our office to arrange for a follow-up visit, and she verbalized understanding.  I will route this recommendation to the requesting party via Epic fax function and remove from pre-op pool.  Please call with questions.  Kerin Ransom, PA-C 03/18/2020, 3:58 PM

## 2020-03-24 ENCOUNTER — Ambulatory Visit: Payer: Self-pay | Admitting: General Surgery

## 2020-03-31 ENCOUNTER — Encounter: Payer: Medicare Other | Admitting: Gastroenterology

## 2020-04-20 ENCOUNTER — Other Ambulatory Visit: Payer: Self-pay | Admitting: Vascular Surgery

## 2020-04-20 DIAGNOSIS — I739 Peripheral vascular disease, unspecified: Secondary | ICD-10-CM

## 2020-04-28 DIAGNOSIS — I70213 Atherosclerosis of native arteries of extremities with intermittent claudication, bilateral legs: Secondary | ICD-10-CM | POA: Diagnosis not present

## 2020-04-28 DIAGNOSIS — Z955 Presence of coronary angioplasty implant and graft: Secondary | ICD-10-CM | POA: Diagnosis not present

## 2020-04-28 DIAGNOSIS — I252 Old myocardial infarction: Secondary | ICD-10-CM | POA: Diagnosis not present

## 2020-04-28 DIAGNOSIS — I10 Essential (primary) hypertension: Secondary | ICD-10-CM | POA: Diagnosis not present

## 2020-05-04 DIAGNOSIS — R300 Dysuria: Secondary | ICD-10-CM | POA: Diagnosis not present

## 2020-05-04 DIAGNOSIS — N3091 Cystitis, unspecified with hematuria: Secondary | ICD-10-CM | POA: Diagnosis not present

## 2020-05-11 DIAGNOSIS — C539 Malignant neoplasm of cervix uteri, unspecified: Secondary | ICD-10-CM | POA: Insufficient documentation

## 2020-05-11 DIAGNOSIS — I10 Essential (primary) hypertension: Secondary | ICD-10-CM | POA: Insufficient documentation

## 2020-05-11 DIAGNOSIS — I509 Heart failure, unspecified: Secondary | ICD-10-CM | POA: Insufficient documentation

## 2020-05-11 DIAGNOSIS — K219 Gastro-esophageal reflux disease without esophagitis: Secondary | ICD-10-CM | POA: Insufficient documentation

## 2020-05-11 DIAGNOSIS — F32A Depression, unspecified: Secondary | ICD-10-CM | POA: Insufficient documentation

## 2020-05-11 DIAGNOSIS — I079 Rheumatic tricuspid valve disease, unspecified: Secondary | ICD-10-CM | POA: Insufficient documentation

## 2020-05-11 DIAGNOSIS — E785 Hyperlipidemia, unspecified: Secondary | ICD-10-CM | POA: Insufficient documentation

## 2020-05-11 DIAGNOSIS — I739 Peripheral vascular disease, unspecified: Secondary | ICD-10-CM | POA: Insufficient documentation

## 2020-05-11 DIAGNOSIS — I8390 Asymptomatic varicose veins of unspecified lower extremity: Secondary | ICD-10-CM | POA: Insufficient documentation

## 2020-05-11 DIAGNOSIS — I359 Nonrheumatic aortic valve disorder, unspecified: Secondary | ICD-10-CM | POA: Insufficient documentation

## 2020-05-11 DIAGNOSIS — I639 Cerebral infarction, unspecified: Secondary | ICD-10-CM | POA: Insufficient documentation

## 2020-05-11 DIAGNOSIS — I2510 Atherosclerotic heart disease of native coronary artery without angina pectoris: Secondary | ICD-10-CM | POA: Insufficient documentation

## 2020-05-11 DIAGNOSIS — I219 Acute myocardial infarction, unspecified: Secondary | ICD-10-CM | POA: Insufficient documentation

## 2020-05-13 ENCOUNTER — Ambulatory Visit: Payer: Medicare Other | Admitting: Cardiology

## 2020-05-13 ENCOUNTER — Other Ambulatory Visit: Payer: Self-pay

## 2020-05-13 ENCOUNTER — Encounter: Payer: Self-pay | Admitting: Cardiology

## 2020-05-13 VITALS — BP 124/58 | HR 88 | Ht 63.25 in | Wt 219.2 lb

## 2020-05-13 DIAGNOSIS — I2510 Atherosclerotic heart disease of native coronary artery without angina pectoris: Secondary | ICD-10-CM

## 2020-05-13 DIAGNOSIS — I10 Essential (primary) hypertension: Secondary | ICD-10-CM

## 2020-05-13 DIAGNOSIS — Z01810 Encounter for preprocedural cardiovascular examination: Secondary | ICD-10-CM | POA: Insufficient documentation

## 2020-05-13 DIAGNOSIS — E782 Mixed hyperlipidemia: Secondary | ICD-10-CM | POA: Diagnosis not present

## 2020-05-13 DIAGNOSIS — I739 Peripheral vascular disease, unspecified: Secondary | ICD-10-CM

## 2020-05-13 HISTORY — DX: Encounter for preprocedural cardiovascular examination: Z01.810

## 2020-05-13 NOTE — Addendum Note (Signed)
Addended by: Senaida Ores on: 05/13/2020 01:23 PM   Modules accepted: Orders

## 2020-05-13 NOTE — Progress Notes (Signed)
Cardiology Office Note:    Date:  05/13/2020   ID:  Catherine Crawford, DOB Jul 15, 1965, MRN [id number]  PCP:  Patient, [name]  Cardiologist:  Jenne Campus, MD    Referring MD: No ref. provider found   No chief complaint on file. I need my gallbladder surgery  History of Present Illness:    Catherine Crawford is a 54 y.o. female with past medical history significant for myocardial infarction in 2018, she was also identified to have completely occluded distal abdominal aorta with claudications, does have dyslipidemia, ex-smoker, hypertension.  Comes today to my office.  She does have chronic gallbladder problem with gallstones before elective travel.  She is scheduled to have cholecystectomy done on 18th of this month.  She is here to be evaluated before surgery.  She can walk but not long distance would stop her from walking more is claudication.  She did see vascular surgeon and she is looking for second opinion regarding that since prior vascular surgeon told her there is absolutely no way they can fix it.  She is on Pletal and she try to exercise on the regular basis but able to do maybe 200 feet and then she has to stop.  Cannot climb stairs.  Denies have any chest pain tightness squeezing pressure burning chest  Past Medical History:  Diagnosis Date  . Aortic occlusion ([place]) 04/16/2018  . AVD (aortic valve disease)   . CAD (coronary artery disease)   . Cardiomyopathy ([place])   . Cervical cancer ([place])   . CHF (congestive heart failure) ([place])   . Claudication in peripheral vascular disease ([place]) 02/19/2018   Completely occluded aorta distal to renal arteries  . COPD (chronic obstructive pulmonary disease) ([place])   . Coronary artery disease 02/19/2018   Status post PTCA and stenting of LAD with a 3/30 millimeters stent, stents to RCA 2.5/30 8 mm stent in October 2018 in the face of acute myocardial infarction  . Depression   . Dyslipidemia 02/19/2018  . Essential hypertension 02/19/2018    . GERD (gastroesophageal reflux disease)   . Heart attack ([place])   . Hyperlipidemia   . Hypertension   . Late effect of cerebrovascular accident (CVA) 02/19/2018   With right side numbness as well as peripheral vision loss  . PAD (peripheral artery disease) ([place])   . Stroke ([place])   . Tricuspid valve disorder   . Varicosities of leg     Past Surgical History:  Procedure Laterality Date  . ABDOMINAL HYSTERECTOMY  2002  . CARDIAC CATHETERIZATION    . CESAREAN SECTION    . CORONARY ANGIOPLASTY WITH STENT PLACEMENT    . HERNIA REPAIR  2004   hiatal hernia, double lower abdominal hernia  . PERIPHERAL ARTERIAL STENT GRAFT     x 2    Current Medications: Current Meds  Medication Sig  . Ascorbic Acid (VITAMIN C) 500 MG CAPS Take 1 capsule by mouth daily.  Marland Kitchen aspirin EC 81 MG tablet Take 81 mg by mouth daily.  . carvedilol (COREG) 3.125 MG tablet TAKE 1 TABLET(3.125 MG) BY MOUTH TWICE DAILY  . cilostazol (PLETAL) 100 MG tablet TAKE 1 TABLET BY MOUTH TWICE DAILY  . clopidogrel (PLAVIX) 75 MG tablet Take 75 mg by mouth daily.  . Cyanocobalamin (VITAMIN B-12 PO) Take 1,000 mcg by mouth daily.   Marland Kitchen ezetimibe (ZETIA) 10 MG tablet TAKE 1 TABLET(10 MG) BY MOUTH DAILY  . losartan (COZAAR) 100 MG tablet TAKE 1 TABLET(100 MG) BY MOUTH DAILY  .  Magnesium 250 MG TABS Take 1 tablet by mouth daily.  . Misc Natural Products (GREEN TEA) TABS Take 1 tablet by mouth daily.  . Na Sulfate-K Sulfate-Mg Sulf (SUPREP BOWEL PREP KIT) 17.5-3.13-1.6 GM/177ML SOLN Take 1 kit by mouth as directed. Apply Coupon=BIN: [id number] PCN: CN GROUP: [id number] ID: [id number]  . nitroGLYCERIN (NITROSTAT) 0.4 MG SL tablet Place 1 tablet (0.4 mg total) under the tongue every 5 (five) minutes as needed.  . pantoprazole (PROTONIX) 40 MG tablet TAKE 1 TABLET(40 MG) BY MOUTH DAILY  . rosuvastatin (CRESTOR) 40 MG tablet TAKE 1 TABLET BY MOUTH DAILY  . spironolactone (ALDACTONE) 25 MG tablet TAKE ONE-HALF TABLET BY MOUTH TWICE DAILY      Allergies:   Morphine and related   Social History   Socioeconomic History  . Marital status: Married    Spouse name: Not on file  . Number of children: 2  . Years of education: Not on file  . Highest education level: Not on file  Occupational History  . Occupation: disabled  Tobacco Use  . Smoking status: Former Smoker    Packs/day: 2.00    Years: 37.00    Pack years: 74.00    Types: Cigarettes    Quit date: 04/13/2017    Years since quitting: 3.0  . Smokeless tobacco: Never Used  Vaping Use  . Vaping Use: Never used  Substance and Sexual Activity  . Alcohol use: Not Currently  . Drug use: Not Currently  . Sexual activity: Not on file  Other Topics Concern  . Not on file  Social History Narrative   One level with husband      Right handed      Caffeine none      Exercise - not really      Education 12th grade   Social Determinants of Health   Financial Resource Strain:   . Difficulty of Paying Living Expenses: Not on file  Food Insecurity:   . Worried About Charity fundraiser in the Last Year: Not on file  . Ran Out of Food in the Last Year: Not on file  Transportation Needs:   . Lack of Transportation (Medical): Not on file  . Lack of Transportation (Non-Medical): Not on file  Physical Activity:   . Days of Exercise per Week: Not on file  . Minutes of Exercise per Session: Not on file  Stress:   . Feeling of Stress : Not on file  Social Connections:   . Frequency of Communication with Friends and Family: Not on file  . Frequency of Social Gatherings with Friends and Family: Not on file  . Attends Religious Services: Not on file  . Active Member of Clubs or Organizations: Not on file  . Attends Archivist Meetings: Not on file  . Marital Status: Not on file     Family History: The patient's family history includes AAA (abdominal aortic aneurysm) in her mother; Bone cancer in her maternal aunt; Brain cancer in her brother; Breast cancer  in her maternal aunt; CAD in her brother, father, and mother; Cancer in her maternal aunt; Cervical cancer in her maternal aunt; Heart attack in her father and mother; Heart attack (age of onset: 78) in her sister; Heart disease in her brother, father, mother, and sister; Liver cancer in her father; Lung cancer in her mother; Skin cancer in her maternal aunt; Uterine cancer in her maternal aunt. ROS:   Please see the history of present illness.  All 14 point review of systems negative except as described per history of present illness  EKGs/Labs/Other Studies Reviewed:      Recent Labs: 01/27/2020: ALT 13; BUN 20; Creatinine, Ser 1.08; Hemoglobin 12.9; Platelets 226.0; Potassium 4.0; Sodium 140  Recent Lipid Panel    Component Value Date/Time   CHOL 108 09/30/2019 1447   TRIG 60 09/30/2019 1447   HDL 49 09/30/2019 1447   CHOLHDL 2.2 09/30/2019 1447   LDLCALC 46 09/30/2019 1447    Physical Exam:    VS:  BP (!) 124/58   Pulse 88   Ht 5' 3.25" (1.607 m)   Wt 219 lb 3.2 oz (99.4 kg)   SpO2 95%   BMI 38.52 kg/m     Wt Readings from Last 3 Encounters:  05/13/20 219 lb 3.2 oz (99.4 kg)  01/27/20 218 lb (98.9 kg)  12/11/19 213 lb (96.6 kg)     GEN:  Well nourished, well developed in no acute distress HEENT: Normal NECK: No JVD; No carotid bruits LYMPHATICS: No lymphadenopathy CARDIAC: RRR, no murmurs, no rubs, no gallops RESPIRATORY:  Clear to auscultation without rales, wheezing or rhonchi  ABDOMEN: Soft, non-tender, non-distended MUSCULOSKELETAL:  No edema; No deformity  SKIN: Warm and dry LOWER EXTREMITIES: no swelling NEUROLOGIC:  Alert and oriented x 3 PSYCHIATRIC:  Normal affect   ASSESSMENT:    1. Mixed hyperlipidemia   2. Preop cardiovascular exam   3. PAD (peripheral artery disease) ([place])   4. Essential hypertension   5. Coronary artery disease involving native coronary artery of native heart without angina pectoris    PLAN:    In order of problems listed  above:  1. Preop cardiovascular evaluation for this lady with advanced peripheral vascular disease as well as coronary artery disease.  I cannot evaluate her based on her symptomatology since her limitations claudication.  She will be scheduled to have Lexiscan to make sure she does not have any inducible ischemia.  I will not alter any of her medications.  She is on aspirin as well as Plavix.  Plavix can be hold for 5 days before surgery if needed. 2. Mixed dyslipidemia she is taking high intense statin in form of Crestor 40 as well as Zetia.  I did review her last K PN which show me her LDL of 46 and HDL of 49 this is from 09/30/2019.  We will continue present management. 3. Essential hypertension her blood pressures well controlled continue present management. 4. Coronary disease plan as outlined above we will schedule her to have stress test.   Medication Adjustments/Labs and Tests Ordered: Current medicines are reviewed at length with the patient today.  Concerns regarding medicines are outlined above.  No orders of the defined types were placed in this encounter.  Medication changes: No orders of the defined types were placed in this encounter.   Signed, Park Liter, MD, [facility] 05/13/2020 1:08 PM    [place]

## 2020-05-13 NOTE — Patient Instructions (Signed)
Medication Instructions:  Your physician recommends that you continue on your current medications as directed. Please refer to the Current Medication list given to you today.  *If you need a refill on your cardiac medications before your next appointment, please call your pharmacy*   Lab Work: None If you have labs (blood work) drawn today and your tests are completely normal, you will receive your results only by: Marland Kitchen MyChart Message (if you have MyChart) OR . A paper copy in the mail If you have any lab test that is abnormal or we need to change your treatment, we will call you to review the results.   Testing/Procedures:   [facility] [address] [place], [place] [address] Phone:  [phone]    Please arrive 15 minutes prior to your appointment time for registration and insurance purposes.  The test will take approximately 3 to 4 hours to complete; you may bring reading material.  If someone comes with you to your appointment, they will need to remain in the main lobby due to limited space in the testing area. **If you are pregnant or breastfeeding, please notify the nuclear lab prior to your appointment**  How to prepare for your Myocardial Perfusion Test: . Do not eat or drink 3 hours prior to your test, except you may have water. . Do not consume products containing caffeine (regular or decaffeinated) 12 hours prior to your test. (ex: coffee, chocolate, sodas, tea). . Do bring a list of your current medications with you.  If not listed below, you may take your medications as normal.  . Do wear comfortable clothes (no dresses or overalls) and walking shoes, tennis shoes preferred (No heels or open toe shoes are allowed). . Do NOT wear cologne, perfume, aftershave, or lotions (deodorant is allowed). . If these instructions are not followed, your test will have to be rescheduled.  Please report to [address] for your test.  If you have  questions or concerns about your appointment, you can call the [facility] at [phone].  If you cannot keep your appointment, please provide 24 hours notification to the Nuclear Lab, to avoid a possible $50 charge to your account.    Follow-Up: At [facility], you and your health needs are our priority.  As part of our continuing mission to provide you with exceptional heart care, we have created designated Provider Care Teams.  These Care Teams include your primary Cardiologist (physician) and Advanced Practice Providers (APPs -  Physician Assistants and Nurse Practitioners) who all work together to provide you with the care you need, when you need it.  We recommend signing up for the patient portal called "MyChart".  Sign up information is provided on this After Visit Summary.  MyChart is used to connect with patients for Virtual Visits (Telemedicine).  Patients are able to view lab/test results, encounter notes, upcoming appointments, etc.  Non-urgent messages can be sent to your provider as well.   To learn more about what you can do with MyChart, go to [email or web address].    Your next appointment:   5 month(s)  The format for your next appointment:   In Person  Provider:   Jenne Campus, MD   Other Instructions   Cardiac Nuclear Scan A cardiac nuclear scan is a test that measures blood flow to the heart when a person is resting and when he or she is exercising. The test looks for problems such as:  Not enough blood reaching a portion of the heart.  The heart muscle not working normally. You may need this test if:  You have heart disease.  You have had abnormal lab results.  You have had heart surgery or a balloon procedure to open up blocked arteries (angioplasty).  You have chest pain.  You have shortness of breath. In this test, a radioactive dye (tracer) is injected into your bloodstream. After the tracer has traveled to  your heart, an imaging device is used to measure how much of the tracer is absorbed by or distributed to various areas of your heart. This procedure is usually done at a hospital and takes 2-4 hours. Tell a health care provider about:  Any allergies you have.  All medicines you are taking, including vitamins, herbs, eye drops, creams, and over-the-counter medicines.  Any problems you or family members have had with anesthetic medicines.  Any blood disorders you have.  Any surgeries you have had.  Any medical conditions you have.  Whether you are pregnant or may be pregnant. What are the risks? Generally, this is a safe procedure. However, problems may occur, including:  Serious chest pain and heart attack. This is only a risk if the stress portion of the test is done.  Rapid heartbeat.  Sensation of warmth in your chest. This usually passes quickly.  Allergic reaction to the tracer. What happens before the procedure?  Ask your health care provider about changing or stopping your regular medicines. This is especially important if you are taking diabetes medicines or blood thinners.  Follow instructions from your health care provider about eating or drinking restrictions.  Remove your jewelry on the day of the procedure. What happens during the procedure?  An IV will be inserted into one of your veins.  Your health care provider will inject a small amount of radioactive tracer through the IV.  You will wait for 20-40 minutes while the tracer travels through your bloodstream.  Your heart activity will be monitored with an electrocardiogram (ECG).  You will lie down on an exam table.  Images of your heart will be taken for about 15-20 minutes.  You may also have a stress test. For this test, one of the following may be done: ? You will exercise on a treadmill or stationary bike. While you exercise, your heart's activity will be monitored with an ECG, and your blood  pressure will be checked. ? You will be given medicines that will increase blood flow to parts of your heart. This is done if you are unable to exercise.  When blood flow to your heart has peaked, a tracer will again be injected through the IV.  After 20-40 minutes, you will get back on the exam table and have more images taken of your heart.  Depending on the type of tracer used, scans may need to be repeated 3-4 hours later.  Your IV line will be removed when the procedure is over. The procedure may vary among health care providers and hospitals. What happens after the procedure?  Unless your health care provider tells you otherwise, you may return to your normal schedule, including diet, activities, and medicines.  Unless your health care provider tells you otherwise, you may increase your fluid intake. This will help to flush the contrast dye from your body. Drink enough fluid to keep your urine pale yellow.  Ask your health care provider, or the department that is doing the test: ? When will my results   be ready? ? How will I get my results? Summary  A cardiac nuclear scan measures the blood flow to the heart when a person is resting and when he or she is exercising.  Tell your health care provider if you are pregnant.  Before the procedure, ask your health care provider about changing or stopping your regular medicines. This is especially important if you are taking diabetes medicines or blood thinners.  After the procedure, unless your health care provider tells you otherwise, increase your fluid intake. This will help flush the contrast dye from your body.  After the procedure, unless your health care provider tells you otherwise, you may return to your normal schedule, including diet, activities, and medicines. This information is not intended to replace advice given to you by your health care provider. Make sure you discuss any questions you have with your health care  provider. Document Revised: 12/10/2017 Document Reviewed: 12/10/2017 Elsevier Patient Education  2020 Elsevier Inc.   

## 2020-05-18 ENCOUNTER — Encounter: Payer: Self-pay | Admitting: *Deleted

## 2020-05-18 ENCOUNTER — Telehealth: Payer: Self-pay | Admitting: *Deleted

## 2020-05-18 NOTE — Telephone Encounter (Signed)
Left message on voicemail per DPR in reference to upcoming appointment scheduled on 05/20/2020 at 0800 with detailed instructions given per Myocardial Perfusion Study Information Sheet for the test. LM to arrive 15 minutes early, and that it is imperative to arrive on time for appointment to keep from having the test rescheduled. If you need to cancel or reschedule your appointment, please call the office within 24 hours of your appointment. Failure to do so may result in a cancellation of your appointment, and a $50 no show fee. Phone number given for call back for any questions.  Mychart letter sent with instructions.Mia Winthrop, Ranae [place]

## 2020-05-18 NOTE — Patient Instructions (Addendum)
DUE TO COVID-19 ONLY ONE VISITOR IS ALLOWED TO COME WITH YOU AND STAY IN THE WAITING ROOM ONLY DURING PRE OP AND PROCEDURE.   IF YOU WILL BE ADMITTED INTO THE HOSPITAL YOU ARE ALLOWED ONE SUPPORT PERSON DURING VISITATION HOURS ONLY (10AM -8PM)   . The support person may change daily. . The support person must pass our screening, gel in and out, and wear a mask at all times, including in the patient's room. . Patients must also wear a mask when staff or their support person are in the room.   COVID SWAB TESTING MUST BE COMPLETED ON:   Monday, 05-24-20 @ 2:00 PM   [address] [place], [place] [address]  (Must self quarantine after testing. Follow instructions on handout.)     Your procedure is scheduled on:   Thursday, 05-27-20   Report to [facility] Main  Entrance   Report to admitting at 9:30 AM   Call this number if you have problems the morning of surgery [phone]   Do not eat food :After Midnight.   May have liquids until 8:30 AM day of surgery  CLEAR LIQUID DIET  Foods Allowed                                                                     Foods Excluded  Water, Black Coffee and tea, regular and decaf           liquids that you cannot  Plain Jell-O in any flavor  (No red)                                  see through such as: Fruit ices (not with fruit pulp)                                      milk, soups, orange juice              Iced Popsicles (No red)                                      All solid food                                   Apple juices Sports drinks like Gatorade (No red) Lightly seasoned clear broth or consume(fat free) Sugar, honey syrup   Oral Hygiene is also important to reduce your risk of infection.                                    Remember - BRUSH YOUR TEETH THE MORNING OF SURGERY WITH YOUR REGULAR TOOTHPASTE   Do NOT smoke after Midnight   Take these medicines the morning of surgery with A SIP OF WATER: Carvedilol, Zetia,  Pantoprazole  You may not have any metal on your body including hair pins, jewelry, and body piercings             Do not wear make-up, lotions, powders, perfumes/cologne, or deodorant             Do not wear nail polish.  Do not shave  48 hours prior to surgery.          Do not bring valuables to the hospital. [place].   Contacts, dentures or bridgework may not be worn into surgery.     Patients discharged the day of surgery will not be allowed to drive home.               Please read over the following fact sheets you were given: IF YOU HAVE QUESTIONS ABOUT YOUR PRE OP INSTRUCTIONS PLEASE CALL [phone]   [facility] - Preparing for Surgery Before surgery, you can play an important role.  Because skin is not sterile, your skin needs to be as free of germs as possible.  You can reduce the number of germs on your skin by washing with CHG (chlorahexidine gluconate) soap before surgery.  CHG is an antiseptic cleaner which kills germs and bonds with the skin to continue killing germs even after washing. Please DO NOT use if you have an allergy to CHG or antibacterial soaps.  If your skin becomes reddened/irritated stop using the CHG and inform your nurse when you arrive at Short Stay. Do not shave (including legs and underarms) for at least 48 hours prior to the first CHG shower.  You may shave your face/neck.  Please follow these instructions carefully:  1.  Shower with CHG Soap the night before surgery and the  morning of surgery.  2.  If you choose to wash your hair, wash your hair first as usual with your normal  shampoo.  3.  After you shampoo, rinse your hair and body thoroughly to remove the shampoo.                             4.  Use CHG as you would any other liquid soap.  You can apply chg directly to the skin and wash.  Gently with a scrungie or clean washcloth.  5.  Apply the CHG Soap to your body ONLY FROM  THE NECK DOWN.   Do   not use on face/ open                           Wound or open sores. Avoid contact with eyes, ears mouth and   genitals (private parts).                       Wash face,  Genitals (private parts) with your normal soap.             6.  Wash thoroughly, paying special attention to the area where your    surgery  will be performed.  7.  Thoroughly rinse your body with warm water from the neck down.  8.  DO NOT shower/wash with your normal soap after using and rinsing off the CHG Soap.                9.  Pat yourself dry with a clean towel.  10.  Wear clean pajamas.            11.  Place clean sheets on your bed the night of your first shower and do not  sleep with pets. Day of Surgery : Do not apply any lotions/deodorants the morning of surgery.  Please wear clean clothes to the hospital/surgery center.  FAILURE TO FOLLOW THESE INSTRUCTIONS MAY RESULT IN THE CANCELLATION OF YOUR SURGERY  PATIENT SIGNATURE_________________________________  NURSE SIGNATURE__________________________________  ________________________________________________________________________

## 2020-05-18 NOTE — Progress Notes (Addendum)
COVID Vaccine Completed: x2 Date COVID Vaccine completed:  02-02-20 2nd dose COVID vaccine manufacturer: [employer]   PCP - currently no PCP Cardiologist -   Jenne Campus, MD.  Last OV 05-13-20  Cardia Clearance dated 03-18-20 from Kerin Ransom, PA-C in [facility]  Chest x-ray -  EKG - 05-13-20 in [facility] - 05-14-18 in Epic.  Scheduled for stress test 05-20-20 ECHO - 09-30-19 in Epic Cardiac Cath -  Pacemaker/ICD device last checked:  Sleep Study - n/a CPAP -   Fasting Blood Sugar -  Checks Blood Sugar _____ times a day  Blood Thinner Instructions:  Pletal 100 mg bid (ok to hold 2 days prior), Plavix 75 mg (ok to hold 5 days prior) Aspirin Instructions:  ASA 81 (ok to hold 3-5 days prior) Last Dose:  Anesthesia review: CAD, cardiomyopathy, HTN, aortic occlusion, tricuspid valve disorder, Hx of MI and stroke, CHF, COPD, PAD  Patient denies shortness of breath, fever, cough and chest pain at PAT appointment   Patient verbalized understanding of instructions that were given to them at the PAT appointment. Patient was also instructed that they will need to review over the PAT instructions again at home before surgery.

## 2020-05-19 ENCOUNTER — Other Ambulatory Visit: Payer: Self-pay

## 2020-05-19 ENCOUNTER — Encounter (HOSPITAL_COMMUNITY): Payer: Self-pay

## 2020-05-19 ENCOUNTER — Encounter (HOSPITAL_COMMUNITY)
Admission: RE | Admit: 2020-05-19 | Discharge: 2020-05-19 | Disposition: A | Discharge status: Home / Self Care | Payer: Medicare Other | Source: Ambulatory Visit | Attending: General Surgery | Admitting: General Surgery

## 2020-05-19 DIAGNOSIS — Z01812 Encounter for preprocedural laboratory examination: Secondary | ICD-10-CM | POA: Insufficient documentation

## 2020-05-19 HISTORY — DX: Other complications of anesthesia, initial encounter: T88.59XA

## 2020-05-19 LAB — COMPREHENSIVE METABOLIC PANEL
ALT: 16 U/L (ref 0–44)
AST: 17 U/L (ref 15–41)
Albumin: 4.3 g/dL (ref 3.5–5.0)
Alkaline Phosphatase: 60 U/L (ref 38–126)
Anion gap: 9 (ref 5–15)
BUN: 19 mg/dL (ref 6–20)
CO2: 28 mmol/L (ref 22–32)
Calcium: 9.2 mg/dL (ref 8.9–10.3)
Chloride: 107 mmol/L (ref 98–111)
Creatinine, Ser: 1.05 mg/dL — ABNORMAL HIGH (ref 0.44–1.00)
GFR, Estimated: >60 mL/min (ref >60)
Glucose, Bld: 93 mg/dL (ref 70–99)
Potassium: 4.6 mmol/L (ref 3.5–5.1)
Sodium: 144 mmol/L (ref 135–145)
Total Bilirubin: 0.3 mg/dL (ref 0.3–1.2)
Total Protein: 7.6 g/dL (ref 6.5–8.1)

## 2020-05-19 LAB — CBC WITH DIFFERENTIAL/PLATELET
Abs Immature Granulocytes: 0.03 10*3/uL (ref 0.00–0.07)
Basophils Absolute: 0.1 10*3/uL (ref 0.0–0.1)
Basophils Relative: 1 %
Eosinophils Absolute: 0.3 10*3/uL (ref 0.0–0.5)
Eosinophils Relative: 4 %
HCT: 39.1 % (ref 36.0–46.0)
Hemoglobin: 12.4 g/dL (ref 12.0–15.0)
Immature Granulocytes: 1 %
Lymphocytes Relative: 35 %
Lymphs Abs: 2.3 10*3/uL (ref 0.7–4.0)
MCH: 30 pg (ref 26.0–34.0)
MCHC: 31.7 g/dL (ref 30.0–36.0)
MCV: 94.7 fL (ref 80.0–100.0)
Monocytes Absolute: 0.5 10*3/uL (ref 0.1–1.0)
Monocytes Relative: 8 %
Neutro Abs: 3.4 10*3/uL (ref 1.7–7.7)
Neutrophils Relative %: 51 %
Platelets: 246 10*3/uL (ref 150–400)
RBC: 4.13 MIL/uL (ref 3.87–5.11)
RDW: 13.2 % (ref 11.5–15.5)
WBC: 6.6 10*3/uL (ref 4.0–10.5)
nRBC: 0 % (ref 0.0–0.2)

## 2020-05-20 ENCOUNTER — Ambulatory Visit (INDEPENDENT_AMBULATORY_CARE_PROVIDER_SITE_OTHER): Payer: Medicare Other

## 2020-05-20 DIAGNOSIS — I2510 Atherosclerotic heart disease of native coronary artery without angina pectoris: Secondary | ICD-10-CM

## 2020-05-20 DIAGNOSIS — Z01810 Encounter for preprocedural cardiovascular examination: Secondary | ICD-10-CM | POA: Diagnosis not present

## 2020-05-20 DIAGNOSIS — E782 Mixed hyperlipidemia: Secondary | ICD-10-CM | POA: Diagnosis not present

## 2020-05-20 LAB — MYOCARDIAL PERFUSION IMAGING
LV dias vol: 72 mL (ref 46–106)
LV sys vol: 28 mL
Peak HR: 106 {beats}/min
Rest HR: 60 {beats}/min
SDS: 3
SRS: 5
SSS: 8
TID: 1.04

## 2020-05-20 MED ORDER — TECHNETIUM TC 99M TETROFOSMIN IV KIT
10.3000 | PACK | Freq: Once | INTRAVENOUS | Status: AC | PRN
  Administered 2020-05-20: 10.3 via INTRAVENOUS

## 2020-05-20 MED ORDER — REGADENOSON 0.4 MG/5ML IV SOLN
0.4000 mg | Freq: Once | INTRAVENOUS | Status: AC
Start: 2020-05-20 — End: 2020-05-20
  Administered 2020-05-20: 0.4 mg via INTRAVENOUS

## 2020-05-20 MED ORDER — TECHNETIUM TC 99M TETROFOSMIN IV KIT
29.7000 | PACK | Freq: Once | INTRAVENOUS | Status: AC | PRN
  Administered 2020-05-20: 29.7 via INTRAVENOUS

## 2020-05-20 NOTE — Progress Notes (Signed)
Anesthesia Chart Review   Case: [id number] Date/Time: 05/27/20 1115   Procedure: LAPAROSCOPIC CHOLECYSTECTOMY WITH INTRAOPERATIVE CHOLANGIOGRAM (N/A )   Anesthesia type: General   Pre-op diagnosis: SYMPTOMATIC CHOLELITHIASIS   Location: [facility] [facility] / [facility]   Surgeons: Greer Pickerel, MD      DISCUSSION:54 y.o. former smoker (58 pack years, quit 04/13/17) with h/o HTN, GERD, CHF, COPD, CAD, CVA 02/19/2018 with residual right sided numbness and peripheral vision loss, symptomatic cholelithiasis scheduled for above procedure 05/27/20 with Dr. Greer Pickerel.   Pt seen by cardiology 05/13/2020 for preoperative evaluation.  Per OV note, "Preop cardiovascular evaluation for this lady with advanced peripheral vascular disease as well as coronary artery disease.  I cannot evaluate her based on her symptomatology since her limitations claudication.  She will be scheduled to have Lexiscan to make sure she does not have any inducible ischemia.  I will not alter any of her medications.  She is on aspirin as well as Plavix.  Plavix can be hold for 5 days before surgery if needed."  Low risk stress test 05/20/20.   Anticipate pt can proceed with planned procedure barring acute status change.   VS: BP 140/61   Pulse 79   Temp 36.8 C (Oral)   Resp 18   Ht '5\' 3"'  (1.6 m)   Wt 100.5 kg   SpO2 98%   BMI 39.25 kg/m   PROVIDERS: Patient, [name]  Jenne Campus, MD is Cardiologist  LABS: Labs reviewed: Acceptable for surgery. (all labs ordered are listed, but only abnormal results are displayed)  Labs Reviewed  COMPREHENSIVE METABOLIC PANEL - Abnormal; Notable for the following components:      Result Value   Creatinine, Ser 1.05 (*)    All other components within normal limits  CBC WITH DIFFERENTIAL/PLATELET  TYPE AND SCREEN     IMAGES:   EKG: 05/13/20 Rate 88 bpm  NSR  CV: Myocardial Perfusion 05/20/2020  Nuclear stress EF: 61%.  The left ventricular ejection fraction is normal  (55-65%).  There was no ST segment deviation noted during stress.  The study is normal.  This is a low risk study.  Echo 09/30/19 IMPRESSIONS    1. Left ventricular ejection fraction, by estimation, is 60 to 65%. The  left ventricle has normal function. The left ventricle has no regional  wall motion abnormalities. There is mild concentric left ventricular  hypertrophy. Left ventricular diastolic  parameters are consistent with Grade I diastolic dysfunction (impaired  relaxation).  2. Right ventricular systolic function is normal. The right ventricular  size is normal. There is normal pulmonary artery systolic pressure.  3. The mitral valve is normal in structure. No evidence of mitral valve  regurgitation. No evidence of mitral stenosis.  4. The AV is not restricted, there is a mild increase in LVOT gradient  without SAM. The aortic valve is normal in structure. Aortic valve  regurgitation is mild. Mild aortic valve sclerosis is present, with no  evidence of aortic valve stenosis.  5. The inferior vena cava is normal in size with greater than 50%  respiratory variability, suggesting right atrial pressure of 3 mmHg. Past Medical History:  Diagnosis Date  . Aortic occlusion ([place]) 04/16/2018  . AVD (aortic valve disease)   . CAD (coronary artery disease)   . Cardiomyopathy ([place])   . Cervical cancer ([place])   . CHF (congestive heart failure) ([place])   . Claudication in peripheral vascular disease ([place]) 02/19/2018   Completely occluded aorta  distal to renal arteries  . Complication of anesthesia    Woke up once during surgery  . COPD (chronic obstructive pulmonary disease) ([place])   . Coronary artery disease 02/19/2018   Status post PTCA and stenting of LAD with a 3/30 millimeters stent, stents to RCA 2.5/30 8 mm stent in October 2018 in the face of acute myocardial infarction  . Depression   . Dyslipidemia 02/19/2018  . Essential hypertension 02/19/2018  . GERD (gastroesophageal  reflux disease)   . Heart attack ([place])   . Hyperlipidemia   . Hypertension   . Late effect of cerebrovascular accident (CVA) 02/19/2018   With right side numbness as well as peripheral vision loss  . PAD (peripheral artery disease) ([place])   . Stroke ([place])   . Tricuspid valve disorder   . Varicosities of leg     Past Surgical History:  Procedure Laterality Date  . ABDOMINAL HYSTERECTOMY  2002  . CARDIAC CATHETERIZATION    . CESAREAN SECTION    . CORONARY ANGIOPLASTY WITH STENT PLACEMENT    . HERNIA REPAIR  2004   hiatal hernia, double lower abdominal hernia  . MULTIPLE TOOTH EXTRACTIONS    . PERIPHERAL ARTERIAL STENT GRAFT     x 2    MEDICATIONS: . acetaminophen (TYLENOL) 500 MG tablet  . Ascorbic Acid (VITAMIN C) 500 MG CAPS  . aspirin EC 81 MG tablet  . carvedilol (COREG) 3.125 MG tablet  . cilostazol (PLETAL) 100 MG tablet  . clopidogrel (PLAVIX) 75 MG tablet  . ezetimibe (ZETIA) 10 MG tablet  . losartan (COZAAR) 100 MG tablet  . Magnesium 250 MG TABS  . Misc Natural Products (GREEN TEA) TABS  . Na Sulfate-K Sulfate-Mg Sulf (SUPREP BOWEL PREP KIT) 17.5-3.13-1.6 GM/177ML SOLN  . nitroGLYCERIN (NITROSTAT) 0.4 MG SL tablet  . pantoprazole (PROTONIX) 40 MG tablet  . rosuvastatin (CRESTOR) 40 MG tablet  . spironolactone (ALDACTONE) 25 MG tablet  . vitamin B-12 (CYANOCOBALAMIN) 1000 MCG tablet   No current facility-administered medications for this encounter.    Konrad Felix, PA-C [facility]Surgical Testing [phone]

## 2020-05-24 ENCOUNTER — Encounter: Payer: Medicare Other | Admitting: Gastroenterology

## 2020-05-24 ENCOUNTER — Other Ambulatory Visit (HOSPITAL_COMMUNITY)
Admission: RE | Admit: 2020-05-24 | Discharge: 2020-05-24 | Disposition: A | Discharge status: Home / Self Care | Payer: Medicare Other | Source: Ambulatory Visit | Attending: General Surgery | Admitting: General Surgery

## 2020-05-24 DIAGNOSIS — Z01812 Encounter for preprocedural laboratory examination: Secondary | ICD-10-CM | POA: Insufficient documentation

## 2020-05-24 DIAGNOSIS — Z20822 Contact with and (suspected) exposure to covid-19: Secondary | ICD-10-CM | POA: Insufficient documentation

## 2020-05-24 LAB — SARS CORONAVIRUS 2 (TAT 6-24 HRS): SARS Coronavirus 2: NEGATIVE

## 2020-05-24 NOTE — Anesthesia Preprocedure Evaluation (Addendum)
Anesthesia Evaluation  Patient identified by MRN, date of birth, ID band Patient awake    Reviewed: Allergy & Precautions, NPO status , Patient's Chart, lab work & pertinent test results  Airway Mallampati: II  TM Distance: >3 FB     Dental   Pulmonary COPD, former smoker,    breath sounds clear to auscultation       Cardiovascular hypertension, + CAD, + Past MI, + Peripheral Vascular Disease and +CHF   Rhythm:Regular Rate:Normal     Neuro/Psych PSYCHIATRIC DISORDERS Depression CVA    GI/Hepatic Neg liver ROS, GERD  ,  Endo/Other  negative endocrine ROS  Renal/GU negative Renal ROS     Musculoskeletal   Abdominal   Peds  Hematology negative hematology ROS (+)   Anesthesia Other Findings   Reproductive/Obstetrics                            Anesthesia Physical Anesthesia Plan  ASA: III  Anesthesia Plan: General   Post-op Pain Management:    Induction: Intravenous  PONV Risk Score and Plan: 3 and Ondansetron, Dexamethasone and Midazolam  Airway Management Planned: Oral ETT  Additional Equipment:   Intra-op Plan:   Post-operative Plan:   Informed Consent: I have reviewed the patients History and Physical, chart, labs and discussed the procedure including the risks, benefits and alternatives for the proposed anesthesia with the patient or authorized representative who has indicated his/her understanding and acceptance.     Dental advisory given  Plan Discussed with: CRNA and Anesthesiologist  Anesthesia Plan Comments: (See PAT note 05/19/20, Konrad Felix, PA-C)       Anesthesia Quick Evaluation

## 2020-05-27 ENCOUNTER — Ambulatory Visit (HOSPITAL_COMMUNITY): Payer: Medicare Other | Admitting: Anesthesiology

## 2020-05-27 ENCOUNTER — Observation Stay (HOSPITAL_COMMUNITY)
Admission: RE | Admit: 2020-05-27 | Discharge: 2020-05-28 | Disposition: A | Discharge status: Home / Self Care | Payer: Medicare Other | Attending: General Surgery | Admitting: General Surgery

## 2020-05-27 ENCOUNTER — Ambulatory Visit (HOSPITAL_COMMUNITY): Payer: Medicare Other | Admitting: Physician Assistant

## 2020-05-27 ENCOUNTER — Encounter (HOSPITAL_COMMUNITY)
Admission: RE | Disposition: A | Discharge status: Home / Self Care | Payer: Self-pay | Source: Home / Self Care | Attending: General Surgery

## 2020-05-27 ENCOUNTER — Other Ambulatory Visit: Payer: Self-pay

## 2020-05-27 ENCOUNTER — Encounter (HOSPITAL_COMMUNITY): Payer: Self-pay | Admitting: General Surgery

## 2020-05-27 DIAGNOSIS — K811 Chronic cholecystitis: Secondary | ICD-10-CM

## 2020-05-27 DIAGNOSIS — E785 Hyperlipidemia, unspecified: Secondary | ICD-10-CM | POA: Diagnosis not present

## 2020-05-27 DIAGNOSIS — I509 Heart failure, unspecified: Secondary | ICD-10-CM | POA: Insufficient documentation

## 2020-05-27 DIAGNOSIS — K8010 Calculus of gallbladder with chronic cholecystitis without obstruction: Secondary | ICD-10-CM | POA: Diagnosis not present

## 2020-05-27 DIAGNOSIS — Z87891 Personal history of nicotine dependence: Secondary | ICD-10-CM | POA: Diagnosis not present

## 2020-05-27 DIAGNOSIS — J449 Chronic obstructive pulmonary disease, unspecified: Secondary | ICD-10-CM | POA: Insufficient documentation

## 2020-05-27 DIAGNOSIS — Z8541 Personal history of malignant neoplasm of cervix uteri: Secondary | ICD-10-CM | POA: Diagnosis not present

## 2020-05-27 DIAGNOSIS — I429 Cardiomyopathy, unspecified: Secondary | ICD-10-CM | POA: Diagnosis not present

## 2020-05-27 DIAGNOSIS — I2510 Atherosclerotic heart disease of native coronary artery without angina pectoris: Secondary | ICD-10-CM | POA: Insufficient documentation

## 2020-05-27 DIAGNOSIS — I110 Hypertensive heart disease with heart failure: Secondary | ICD-10-CM | POA: Insufficient documentation

## 2020-05-27 DIAGNOSIS — R1011 Right upper quadrant pain: Secondary | ICD-10-CM | POA: Diagnosis present

## 2020-05-27 HISTORY — PX: CHOLECYSTECTOMY: SHX55

## 2020-05-27 HISTORY — DX: Chronic cholecystitis: K81.1

## 2020-05-27 LAB — TYPE AND SCREEN
ABO/RH(D): A NEG
Antibody Screen: NEGATIVE

## 2020-05-27 LAB — ABO/RH: ABO/RH(D): A NEG

## 2020-05-27 SURGERY — LAPAROSCOPIC CHOLECYSTECTOMY WITH INTRAOPERATIVE CHOLANGIOGRAM
Anesthesia: General

## 2020-05-27 MED ORDER — LOSARTAN POTASSIUM 50 MG PO TABS
100.0000 mg | ORAL_TABLET | Freq: Every day | ORAL | Status: DC
  Administered 2020-05-28: 100 mg via ORAL
  Filled 2020-05-27: qty 2

## 2020-05-27 MED ORDER — BUPIVACAINE-EPINEPHRINE (PF) 0.25% -1:200000 IJ SOLN
INTRAMUSCULAR | Status: AC
  Filled 2020-05-27: qty 30

## 2020-05-27 MED ORDER — ONDANSETRON 4 MG PO TBDP: 4.0000 mg | ORAL_TABLET | Freq: Four times a day (QID) | ORAL | Status: DC | PRN

## 2020-05-27 MED ORDER — PHENYLEPHRINE 40 MCG/ML (10ML) SYRINGE FOR IV PUSH (FOR BLOOD PRESSURE SUPPORT)
PREFILLED_SYRINGE | INTRAVENOUS | Status: DC | PRN
  Administered 2020-05-27: 80 ug via INTRAVENOUS
  Administered 2020-05-27: 120 ug via INTRAVENOUS

## 2020-05-27 MED ORDER — ORAL CARE MOUTH RINSE: 15.0000 mL | Freq: Once | OROMUCOSAL | Status: AC

## 2020-05-27 MED ORDER — LIDOCAINE 2% (20 MG/ML) 5 ML SYRINGE
INTRAMUSCULAR | Status: DC | PRN
  Administered 2020-05-27: 60 mg via INTRAVENOUS

## 2020-05-27 MED ORDER — ONDANSETRON HCL 4 MG/2ML IJ SOLN
INTRAMUSCULAR | Status: DC | PRN
  Administered 2020-05-27: 4 mg via INTRAVENOUS

## 2020-05-27 MED ORDER — ONDANSETRON HCL 4 MG/2ML IJ SOLN: 4.0000 mg | Freq: Four times a day (QID) | INTRAMUSCULAR | Status: DC | PRN

## 2020-05-27 MED ORDER — CHLORHEXIDINE GLUCONATE CLOTH 2 % EX PADS: 6.0000 | MEDICATED_PAD | Freq: Once | CUTANEOUS | Status: DC

## 2020-05-27 MED ORDER — OXYCODONE HCL 5 MG/5ML PO SOLN: 5.0000 mg | Freq: Once | ORAL | Status: DC | PRN

## 2020-05-27 MED ORDER — ACETAMINOPHEN 325 MG PO TABS: 325.0000 mg | ORAL_TABLET | ORAL | Status: DC | PRN

## 2020-05-27 MED ORDER — ONDANSETRON HCL 4 MG/2ML IJ SOLN: 4.0000 mg | Freq: Once | INTRAMUSCULAR | Status: DC | PRN

## 2020-05-27 MED ORDER — ROSUVASTATIN CALCIUM 20 MG PO TABS
40.0000 mg | ORAL_TABLET | Freq: Every evening | ORAL | Status: DC
  Administered 2020-05-27: 40 mg via ORAL
  Filled 2020-05-27: qty 2

## 2020-05-27 MED ORDER — FENTANYL CITRATE (PF) 250 MCG/5ML IJ SOLN
INTRAMUSCULAR | Status: AC
  Filled 2020-05-27: qty 5

## 2020-05-27 MED ORDER — PANTOPRAZOLE SODIUM 40 MG PO TBEC
40.0000 mg | DELAYED_RELEASE_TABLET | Freq: Every day | ORAL | Status: DC
  Administered 2020-05-28: 40 mg via ORAL
  Filled 2020-05-27: qty 1

## 2020-05-27 MED ORDER — SUGAMMADEX SODIUM 200 MG/2ML IV SOLN
INTRAVENOUS | Status: DC | PRN
  Administered 2020-05-27: 200 mg via INTRAVENOUS

## 2020-05-27 MED ORDER — 0.9 % SODIUM CHLORIDE (POUR BTL) OPTIME
TOPICAL | Status: DC | PRN
  Administered 2020-05-27: 1000 mL

## 2020-05-27 MED ORDER — DEXAMETHASONE SODIUM PHOSPHATE 10 MG/ML IJ SOLN
INTRAMUSCULAR | Status: AC
  Filled 2020-05-27: qty 1

## 2020-05-27 MED ORDER — MIDAZOLAM HCL 5 MG/5ML IJ SOLN
INTRAMUSCULAR | Status: DC | PRN
  Administered 2020-05-27: 2 mg via INTRAVENOUS

## 2020-05-27 MED ORDER — ENOXAPARIN SODIUM 40 MG/0.4ML ~~LOC~~ SOLN: 40.0000 mg | SUBCUTANEOUS | Status: DC

## 2020-05-27 MED ORDER — CHLORHEXIDINE GLUCONATE 0.12 % MT SOLN
15.0000 mL | Freq: Once | OROMUCOSAL | Status: AC
  Administered 2020-05-27: 15 mL via OROMUCOSAL

## 2020-05-27 MED ORDER — ROCURONIUM BROMIDE 10 MG/ML (PF) SYRINGE
PREFILLED_SYRINGE | INTRAVENOUS | Status: AC
  Filled 2020-05-27: qty 10

## 2020-05-27 MED ORDER — DEXAMETHASONE SODIUM PHOSPHATE 4 MG/ML IJ SOLN
INTRAMUSCULAR | Status: DC | PRN
  Administered 2020-05-27: 4 mg via INTRAVENOUS

## 2020-05-27 MED ORDER — BUPIVACAINE-EPINEPHRINE 0.25% -1:200000 IJ SOLN
INTRAMUSCULAR | Status: DC | PRN
  Administered 2020-05-27: 30 mL

## 2020-05-27 MED ORDER — LACTATED RINGERS IV SOLN
INTRAVENOUS | Status: AC | PRN
  Administered 2020-05-27: 1000 mL

## 2020-05-27 MED ORDER — NITROGLYCERIN 0.4 MG SL SUBL: 0.4000 mg | SUBLINGUAL_TABLET | SUBLINGUAL | Status: DC | PRN

## 2020-05-27 MED ORDER — CARVEDILOL 3.125 MG PO TABS
3.1250 mg | ORAL_TABLET | Freq: Two times a day (BID) | ORAL | Status: DC
  Administered 2020-05-27 – 2020-05-28 (×2): 3.125 mg via ORAL
  Filled 2020-05-27 (×2): qty 1

## 2020-05-27 MED ORDER — ONDANSETRON HCL 4 MG/2ML IJ SOLN
INTRAMUSCULAR | Status: AC
  Filled 2020-05-27: qty 2

## 2020-05-27 MED ORDER — MIDAZOLAM HCL 2 MG/2ML IJ SOLN
INTRAMUSCULAR | Status: AC
  Filled 2020-05-27: qty 2

## 2020-05-27 MED ORDER — MEPERIDINE HCL 50 MG/ML IJ SOLN: 6.2500 mg | INTRAMUSCULAR | Status: DC | PRN

## 2020-05-27 MED ORDER — FENTANYL CITRATE (PF) 100 MCG/2ML IJ SOLN
INTRAMUSCULAR | Status: AC
  Filled 2020-05-27: qty 2

## 2020-05-27 MED ORDER — PROPOFOL 10 MG/ML IV BOLUS
INTRAVENOUS | Status: DC | PRN
  Administered 2020-05-27: 150 mg via INTRAVENOUS

## 2020-05-27 MED ORDER — ACETAMINOPHEN 160 MG/5ML PO SOLN: 325.0000 mg | ORAL | Status: DC | PRN

## 2020-05-27 MED ORDER — ROCURONIUM BROMIDE 10 MG/ML (PF) SYRINGE
PREFILLED_SYRINGE | INTRAVENOUS | Status: DC | PRN
  Administered 2020-05-27: 50 mg via INTRAVENOUS

## 2020-05-27 MED ORDER — OXYCODONE HCL 5 MG PO TABS: 5.0000 mg | ORAL_TABLET | ORAL | Status: DC | PRN

## 2020-05-27 MED ORDER — LACTATED RINGERS IV SOLN: INTRAVENOUS | Status: DC

## 2020-05-27 MED ORDER — SODIUM CHLORIDE 0.9 % IV SOLN
2.0000 g | INTRAVENOUS | Status: AC
  Administered 2020-05-27: 2 g via INTRAVENOUS
  Filled 2020-05-27: qty 2

## 2020-05-27 MED ORDER — LIDOCAINE 2% (20 MG/ML) 5 ML SYRINGE
INTRAMUSCULAR | Status: AC
  Filled 2020-05-27: qty 5

## 2020-05-27 MED ORDER — FENTANYL CITRATE (PF) 100 MCG/2ML IJ SOLN
INTRAMUSCULAR | Status: DC | PRN
  Administered 2020-05-27 (×4): 50 ug via INTRAVENOUS

## 2020-05-27 MED ORDER — ACETAMINOPHEN 325 MG PO TABS
650.0000 mg | ORAL_TABLET | Freq: Four times a day (QID) | ORAL | Status: DC
  Administered 2020-05-27 – 2020-05-28 (×3): 650 mg via ORAL
  Filled 2020-05-27 (×3): qty 2

## 2020-05-27 MED ORDER — ACETAMINOPHEN 500 MG PO TABS
1000.0000 mg | ORAL_TABLET | ORAL | Status: AC
  Administered 2020-05-27: 1000 mg via ORAL
  Filled 2020-05-27: qty 2

## 2020-05-27 MED ORDER — GABAPENTIN 100 MG PO CAPS
200.0000 mg | ORAL_CAPSULE | ORAL | Status: AC
  Administered 2020-05-27: 200 mg via ORAL
  Filled 2020-05-27: qty 2

## 2020-05-27 MED ORDER — FENTANYL CITRATE (PF) 100 MCG/2ML IJ SOLN
25.0000 ug | INTRAMUSCULAR | Status: DC | PRN
  Administered 2020-05-27: 50 ug via INTRAVENOUS

## 2020-05-27 MED ORDER — OXYCODONE HCL 5 MG PO TABS: 5.0000 mg | ORAL_TABLET | Freq: Once | ORAL | Status: DC | PRN

## 2020-05-27 MED ORDER — PHENYLEPHRINE 40 MCG/ML (10ML) SYRINGE FOR IV PUSH (FOR BLOOD PRESSURE SUPPORT)
PREFILLED_SYRINGE | INTRAVENOUS | Status: AC
  Filled 2020-05-27: qty 10

## 2020-05-27 MED ORDER — ASPIRIN EC 81 MG PO TBEC
81.0000 mg | DELAYED_RELEASE_TABLET | Freq: Every day | ORAL | Status: DC
  Administered 2020-05-28: 81 mg via ORAL
  Filled 2020-05-27: qty 1

## 2020-05-27 SURGICAL SUPPLY — 54 items
APPLICATOR ARISTA FLEXITIP XL (MISCELLANEOUS) IMPLANT
APPLIER CLIP 5 13 M/L LIGAMAX5 (MISCELLANEOUS)
APPLIER CLIP ROT 10 11.4 M/L (STAPLE)
BENZOIN TINCTURE PRP APPL 2/3 (GAUZE/BANDAGES/DRESSINGS) IMPLANT
BNDG ADH 1X3 SHEER STRL LF (GAUZE/BANDAGES/DRESSINGS) ×12 IMPLANT
CABLE HIGH FREQUENCY MONO STRZ (ELECTRODE) ×3 IMPLANT
CHLORAPREP W/TINT 26 (MISCELLANEOUS) ×3 IMPLANT
CLIP APPLIE 5 13 M/L LIGAMAX5 (MISCELLANEOUS) IMPLANT
CLIP APPLIE ROT 10 11.4 M/L (STAPLE) IMPLANT
CLIP VESOLOCK MED LG 6/CT (CLIP) IMPLANT
CLOSURE WOUND 1/2 X4 (GAUZE/BANDAGES/DRESSINGS)
COVER MAYO STAND STRL (DRAPES) IMPLANT
COVER SURGICAL LIGHT HANDLE (MISCELLANEOUS) ×3 IMPLANT
COVER WAND RF STERILE (DRAPES) IMPLANT
DECANTER SPIKE VIAL GLASS SM (MISCELLANEOUS) ×3 IMPLANT
DERMABOND ADVANCED (GAUZE/BANDAGES/DRESSINGS)
DERMABOND ADVANCED .7 DNX12 (GAUZE/BANDAGES/DRESSINGS) IMPLANT
DRAPE C-ARM 42X120 X-RAY (DRAPES) IMPLANT
DRSG TEGADERM 2-3/8X2-3/4 SM (GAUZE/BANDAGES/DRESSINGS) ×3 IMPLANT
ELECT REM PT RETURN 15FT ADLT (MISCELLANEOUS) ×3 IMPLANT
GAUZE SPONGE 2X2 8PLY STRL LF (GAUZE/BANDAGES/DRESSINGS) IMPLANT
GLOVE BIO SURGEON STRL SZ7.5 (GLOVE) ×3 IMPLANT
GLOVE INDICATOR 8.0 STRL GRN (GLOVE) ×3 IMPLANT
GOWN STRL REUS W/TWL XL LVL3 (GOWN DISPOSABLE) ×9 IMPLANT
GRASPER SUT TROCAR 14GX15 (MISCELLANEOUS) ×3 IMPLANT
HEMOSTAT ARISTA ABSORB 3G PWDR (HEMOSTASIS) IMPLANT
HEMOSTAT SNOW SURGICEL 2X4 (HEMOSTASIS) ×3 IMPLANT
KIT BASIN OR (CUSTOM PROCEDURE TRAY) ×3 IMPLANT
KIT TURNOVER KIT A (KITS) IMPLANT
L-HOOK LAP DISP 36CM (ELECTROSURGICAL) ×3
LHOOK LAP DISP 36CM (ELECTROSURGICAL) ×1 IMPLANT
POUCH RETRIEVAL ECOSAC 10 (ENDOMECHANICALS) ×1 IMPLANT
POUCH RETRIEVAL ECOSAC 10MM (ENDOMECHANICALS) ×2
SCISSORS LAP 5X35 DISP (ENDOMECHANICALS) ×3 IMPLANT
SET CHOLANGIOGRAPH MIX (MISCELLANEOUS) IMPLANT
SET IRRIG TUBING LAPAROSCOPIC (IRRIGATION / IRRIGATOR) ×3 IMPLANT
SET TUBE SMOKE EVAC HIGH FLOW (TUBING) ×3 IMPLANT
SLEEVE XCEL OPT CAN 5 100 (ENDOMECHANICALS) ×6 IMPLANT
SPONGE GAUZE 2X2 8PLY STER LF (GAUZE/BANDAGES/DRESSINGS) ×1
SPONGE GAUZE 2X2 8PLY STRL LF (GAUZE/BANDAGES/DRESSINGS) ×2 IMPLANT
SPONGE GAUZE 2X2 STER 10/PKG (GAUZE/BANDAGES/DRESSINGS)
STRIP CLOSURE SKIN 1/2X4 (GAUZE/BANDAGES/DRESSINGS) IMPLANT
SUT MNCRL AB 4-0 PS2 18 (SUTURE) ×3 IMPLANT
SUT VIC AB 0 UR5 27 (SUTURE) IMPLANT
SUT VICRYL 0 TIES 12 18 (SUTURE) IMPLANT
SUT VICRYL 0 UR6 27IN ABS (SUTURE) IMPLANT
TAPE STRIPS DRAPE STRL (GAUZE/BANDAGES/DRESSINGS) ×3 IMPLANT
TOWEL OR 17X26 10 PK STRL BLUE (TOWEL DISPOSABLE) ×3 IMPLANT
TOWEL OR NON WOVEN STRL DISP B (DISPOSABLE) ×3 IMPLANT
TRAY LAPAROSCOPIC (CUSTOM PROCEDURE TRAY) ×3 IMPLANT
TROCAR BLADELESS OPT 5 100 (ENDOMECHANICALS) ×3 IMPLANT
TROCAR XCEL 12X100 BLDLESS (ENDOMECHANICALS) ×3 IMPLANT
TROCAR XCEL BLUNT TIP 100MML (ENDOMECHANICALS) IMPLANT
TROCAR XCEL NON-BLD 11X100MML (ENDOMECHANICALS) IMPLANT

## 2020-05-27 NOTE — H&P (Signed)
CC: here for surgery  Requesting provider: n/a  HPI: Catherine Crawford is an 54 y.o. female who is here for laparoscopic cholecystectomy possible lysis of adhesions.  She denies any medical changes since I saw her in the clinic.  She has undergone cardiac evaluation and received clearance for surgery.  She had a low risk cardiac scan.  She stopped taking her Plavix and claudication medicine last Friday.  The patient is a 54 year old female who presents with abdominal pain. She is referred by Dr Ardis Hughs for evaluation of gallstones and potential incisional hernia. She complains of stabbing right upper quadrant pain. She states that it feels like something is pushing out. This pain lasted about 1-5 minutes. It will occur randomly. She specifically points to her right upper quadrant away from her umbilical area. She also complains of intermittent pain on her right side underneath her right rib cage. She states this will radiate to her back. It generally last about 30 minutes. This is been going on for years. She initially thought it was muscle spasms. If she lays on that right side it'll help decrease the pain. No nausea, vomiting, or bloating. Daily bowel movement. She does have some rectal bleeding. She was scheduled for colonoscopy since she hasn't had one but canceled it because she wanted to get her abdominal pain and vascular issues sorted out first. She has significant coronary artery disease and PAD history. She had multiple cardiac stents as well as multiple pelvic stents placed. It sounds like some of her pelvic stents are occluded. She is having some claudication. But no rest pain. She states she can walk a few 100 yards before getting pain in her calves. She is on Plavix. She had a hysterectomy and developed a hernia. She states that she underwent hernia repair with mesh which was performed through 2 small incisions all to the side. They did not go back through her lower  midline.  She denies any chest pain, chest pressure, shortness of breath. She denies any TIAs or amaurosis fugax. She does endorse some dyspnea on exertion  I reviewed GI medicine's note, I reviewed her echocardiogram. Also reviewed her CT scan. She has what appears to be eventration of the mesh of the umbilicus. The defect of the umbilicus measures about 5 cm wide by about 4 cm vertical. The mesh extends down the lower midline to about 5 cm above the pubis. The abdominal wall is mostly intact in the lower midline. She has metal tacks. On the CT the also read hepatomegaly a, laxity in the abdominal wall. By iliac stent graft  Past Medical History:  Diagnosis Date  . Aortic occlusion ([place]) 04/16/2018  . AVD (aortic valve disease)   . CAD (coronary artery disease)   . Cardiomyopathy ([place])   . Cervical cancer ([place])   . CHF (congestive heart failure) ([place])   . Claudication in peripheral vascular disease ([place]) 02/19/2018   Completely occluded aorta distal to renal arteries  . Complication of anesthesia    Woke up once during surgery  . COPD (chronic obstructive pulmonary disease) ([place])   . Coronary artery disease 02/19/2018   Status post PTCA and stenting of LAD with a 3/30 millimeters stent, stents to RCA 2.5/30 8 mm stent in October 2018 in the face of acute myocardial infarction  . Depression   . Dyslipidemia 02/19/2018  . Essential hypertension 02/19/2018  . GERD (gastroesophageal reflux disease)   . Heart attack ([place])   . Hyperlipidemia   .  Hypertension   . Late effect of cerebrovascular accident (CVA) 02/19/2018   With right side numbness as well as peripheral vision loss  . PAD (peripheral artery disease) ([place])   . Stroke ([place])   . Tricuspid valve disorder   . Varicosities of leg     Past Surgical History:  Procedure Laterality Date  . ABDOMINAL HYSTERECTOMY  2002  . CARDIAC CATHETERIZATION    . CESAREAN SECTION    . CORONARY ANGIOPLASTY WITH STENT PLACEMENT    .  HERNIA REPAIR  2004   hiatal hernia, double lower abdominal hernia  . MULTIPLE TOOTH EXTRACTIONS    . PERIPHERAL ARTERIAL STENT GRAFT     x 2    Family History  Problem Relation Age of Onset  . Heart attack Mother   . CAD Mother   . Heart disease Mother   . AAA (abdominal aortic aneurysm) Mother   . Lung cancer Mother   . Heart attack Father   . CAD Father   . Heart disease Father   . Liver cancer Father        mets to lung  . Heart attack Sister 78  . Heart disease Sister   . CAD Brother   . Heart disease Brother   . Brain cancer Brother   . Bone cancer Maternal Aunt   . Uterine cancer Maternal Aunt   . Cervical cancer Maternal Aunt   . Skin cancer Maternal Aunt   . Breast cancer Maternal Aunt   . Cancer Maternal Aunt        spinal     Social:  reports that she quit smoking about 3 years ago. Her smoking use included cigarettes. She has a 74.00 pack-year smoking history. She has never used smokeless tobacco. She reports previous alcohol use. She reports previous drug use.  Allergies:  Allergies  Allergen Reactions  . Bee Venom Anaphylaxis  . Morphine And Related Nausea And Vomiting    Medications: I have reviewed the patient's current medications.   ROS - all of the below systems have been reviewed with the patient and positives are indicated with bold text General: chills, fever or night sweats Eyes: blurry vision or double vision ENT: epistaxis or sore throat Allergy/Immunology: itchy/watery eyes or nasal congestion Hematologic/Lymphatic: bleeding problems, blood clots or swollen lymph nodes Endocrine: temperature intolerance or unexpected weight changes Breast: new or changing breast lumps or nipple discharge Resp: cough, shortness of breath, or wheezing CV: chest pain or dyspnea on exertion GI: as per HPI GU: dysuria, trouble voiding, or hematuria MSK: joint pain or joint stiffness Neuro: TIA or stroke symptoms Derm: pruritus and skin lesion  changes Psych: anxiety and depression  PE Blood pressure 139/60, pulse 63, temperature 98.4 F (36.9 C), temperature source Oral, resp. rate 16, height 5\' 3"  (1.6 m), weight 100.5 kg, SpO2 100 %. Constitutional: NAD; conversant; no deformities Eyes: Moist conjunctiva; no lid lag; anicteric; PERRL Neck: Trachea midline; no thyromegaly Lungs: Normal respiratory effort; no tactile fremitus CV: RRR; no palpable thrills; no pitting edema GI: Abd soft, nt, old lower abd incisions; no palpable hepatosplenomegaly MSK: Normal gait; no clubbing/cyanosis Psychiatric: Appropriate affect; alert and oriented x3 Lymphatic: No palpable cervical or axillary lymphadenopathy Skin:no rash/lesions/induration  No results found for this or any previous visit (from the past 37 hour(s)).  No results found.  Imaging: reviewed  A/P: Catherine Crawford is an 54 y.o. female with  Symptomatic cholelithiasis Obesity CAD and PAD status post stents Incisional hernia status post  laparoscopic repair of incisional hernia with mesh  I reviewed operative plan with the patient.  We will gain access to the abdomen in the upper abdomen using Optiview technique given her prior ventral hernia repair with mesh and what appears to be eventration of the mesh at the umbilicus.  We discussed that she is at slight increased risk for enterotomy given her extensive abdominal surgical history  She has held her oral blood thinners  IV antibiotic Enhance recovery protocol  I discussed the surgical resident's involvement with her case  Leighton Ruff. Redmond Pulling, MD, FACS General, Bariatric, & Minimally Invasive Surgery [facility], [place]

## 2020-05-27 NOTE — Anesthesia Procedure Notes (Signed)
Procedure Name: Intubation Date/Time: 05/27/2020 11:23 AM Performed by: Lieutenant Diego, CRNA Pre-anesthesia Checklist: Patient identified, Emergency Drugs available, Suction available and Patient being monitored Patient Re-evaluated:Patient Re-evaluated prior to induction Oxygen Delivery Method: Circle system utilized Preoxygenation: Pre-oxygenation with 100% oxygen Induction Type: IV induction Ventilation: Mask ventilation without difficulty Laryngoscope Size: Miller and 2 Grade View: Grade I Tube type: Oral Tube size: 7.0 mm Number of attempts: 1 Airway Equipment and Method: Stylet and Oral airway Placement Confirmation: ETT inserted through vocal cords under direct vision,  positive ETCO2 and breath sounds checked- equal and bilateral Secured at: 23 cm Tube secured with: Tape Dental Injury: Teeth and Oropharynx as per pre-operative assessment

## 2020-05-27 NOTE — Transfer of Care (Signed)
Immediate Anesthesia Transfer of Care Note  Patient: Catherine Crawford  Procedure(s) Performed: LAPAROSCOPIC CHOLECYSTECTOMY (N/A )  Patient Location: PACU  Anesthesia Type:General  Level of Consciousness: awake and alert   Airway & Oxygen Therapy: Patient Spontanous Breathing and Patient connected to face mask oxygen  Post-op Assessment: Report given to RN and Post -op Vital signs reviewed and stable  Post vital signs: Reviewed and stable  Last Vitals:  Vitals Value Taken Time  BP 164/93 05/27/20 1230  Temp    Pulse 73 05/27/20 1232  Resp 11 05/27/20 1232  SpO2 99 % 05/27/20 1232  Vitals shown include unvalidated device data.  Last Pain:  Vitals:   05/27/20 1038  TempSrc:   PainSc: 3       Patients Stated Pain Goal: 4 (12/81/18 8677)  Complications: No complications documented.

## 2020-05-27 NOTE — Op Note (Signed)
Laparoscopic Cholecystectomy Procedure Note  Indications: This patient presents with symptomatic gallbladder disease and will undergo laparoscopic cholecystectomy.  Pre-operative Diagnosis: Chronic cholecystitis  Post-operative Diagnosis: Same  Surgeon: Greer Pickerel, MD  Assistants: Sheria Lang, MD  Anesthesia: General endotracheal anesthesia  ASA Class: 4  Procedure Details  The patient was seen again in the Holding Room. The risks, benefits, complications, treatment options, and expected outcomes were discussed with the patient. The possibilities of reaction to medication, pulmonary aspiration, perforation of viscus, bleeding, recurrent infection, finding a normal gallbladder, the need for additional procedures, failure to diagnose a condition, the possible need to convert to an open procedure, and creating a complication requiring transfusion or operation were discussed with the patient. The likelihood of improving the patient's symptoms with return to their baseline status is good.  The patient and/or family concurred with the proposed plan, giving informed consent. The site of surgery properly noted. The patient was taken to Operating Room, identified as Catherine Crawford and the procedure verified as Laparoscopic Cholecystectomy. A Time Out was held and the above information confirmed.  Prior to the induction of general anesthesia, antibiotic prophylaxis was administered. General endotracheal anesthesia was then administered and tolerated well. After the induction, the abdomen was prepped with Chloraprep and draped in sterile fashion. The patient was positioned in the supine position.  A 5-mm incision was made in the left upper quadrant and optical trocar was used to gain access to the peritoneal cavity under direct vision. Pneumoperitoneum was then created with CO2 and tolerated well without any adverse changes in the patient's vital signs. A 5-mm port was placed in the supra-umbilical  position.  Two 5-mm ports were placed in the right upper quadrant, and the left upper quadrant trocar was up-sized to a 12 mm port.  We positioned the patient in reverse Trendelenburg, tilted slightly to the patient's left. The peri-umbilical area was visualized with visible mesh just superior to the umbilicus. There were dense omental adhesions up to the mesh and abdominal wall in the lower mid abdomen to just above the umbilicus.  The gallbladder was identified, there were dense omental adhesions covering the gallbladder and the edge of the liver which required dissection with a combination of blunt dissection and electrocautery.  The fundus grasped and retracted cephalad. Additional adhesions were lysed bluntly and with the electrocautery where indicated, taking care not to injure any adjacent organs or viscus. The infundibulum was grasped and retracted laterally, exposing the peritoneum overlying the triangle of Calot. This was then divided and exposed. The cystic duct was clearly identified and dissected circumferentially. A critical view of the cystic duct was obtained. The cystic artery was identified to be anterior to the cystic duct running in near parallel, not in its normal position in Calot's triangle. The cystic duct was then ligated with clips and divided. The cystic artery was, dissected free, ligated with clips and divided as well.   The gallbladder was dissected from the liver bed in retrograde fashion with the electrocautery. The gallbladder was removed and placed in an Eccosac bag. The liver bed was irrigated and inspected. Hemostasis was achieved with the electrocautery. Copious irrigation was utilized and was repeatedly aspirated until clear.  The gallbladder and Eccosac bag were then removed through the left upper quadrant port site.  The fascia at this location was closed with a 0 Vicryl suture with the aid of a laparoscopic suture passing device.   We again inspected the right upper  quadrant for hemostasis.  Pneumoperitoneum  was released as we removed the trocars.  4-0 Monocryl was used to close the skin. Steri-strips, and clean dressings were applied. The patient was then extubated and brought to the recovery room in stable condition. Instrument, sponge, and needle counts were correct at closure and at the conclusion of the case.   Findings: Cholecystitis with Cholelithiasis  Estimated Blood Loss: 10 ml         Drains: None         Specimens: Gallbladder           Complications: None; patient tolerated the procedure well.         Disposition: PACU - hemodynamically stable.         Condition: stable

## 2020-05-27 NOTE — Anesthesia Postprocedure Evaluation (Signed)
Anesthesia Post Note  Patient: Catherine Crawford  Procedure(s) Performed: LAPAROSCOPIC CHOLECYSTECTOMY (N/A )     Patient location during evaluation: PACU Anesthesia Type: General Level of consciousness: awake Pain management: pain level controlled Vital Signs Assessment: post-procedure vital signs reviewed and stable Respiratory status: spontaneous breathing Cardiovascular status: stable Postop Assessment: no apparent nausea or vomiting Anesthetic complications: no   No complications documented.  Last Vitals:  Vitals:   05/27/20 1315 05/27/20 1330  BP: (!) 159/70 (!) 155/85  Pulse: 60 (!) 57  Resp: 10 10  Temp:  36.7 C  SpO2: 96% 94%    Last Pain:  Vitals:   05/27/20 1330  TempSrc:   PainSc: 2                  [name]

## 2020-05-28 ENCOUNTER — Encounter (HOSPITAL_COMMUNITY): Payer: Self-pay | Admitting: General Surgery

## 2020-05-28 DIAGNOSIS — J449 Chronic obstructive pulmonary disease, unspecified: Secondary | ICD-10-CM | POA: Diagnosis not present

## 2020-05-28 DIAGNOSIS — I110 Hypertensive heart disease with heart failure: Secondary | ICD-10-CM | POA: Diagnosis not present

## 2020-05-28 DIAGNOSIS — I509 Heart failure, unspecified: Secondary | ICD-10-CM | POA: Diagnosis not present

## 2020-05-28 DIAGNOSIS — I2510 Atherosclerotic heart disease of native coronary artery without angina pectoris: Secondary | ICD-10-CM | POA: Diagnosis not present

## 2020-05-28 DIAGNOSIS — Z87891 Personal history of nicotine dependence: Secondary | ICD-10-CM | POA: Diagnosis not present

## 2020-05-28 DIAGNOSIS — K811 Chronic cholecystitis: Secondary | ICD-10-CM | POA: Diagnosis not present

## 2020-05-28 LAB — SURGICAL PATHOLOGY

## 2020-05-28 MED ORDER — TRAMADOL HCL 50 MG PO TABS
50.0000 mg | ORAL_TABLET | Freq: Four times a day (QID) | ORAL | 0 refills | Status: DC | PRN
Start: 2020-05-28 — End: 2022-02-09

## 2020-05-28 NOTE — Progress Notes (Signed)
Patient discharged via wheelchair accompanied by staff. Pt. In no acute distress, no complaints, Discharge instructions and teaching were discussed with the patient. AVS given.Pt. verbalized understanding. All belonging are with the patient. Needs attended to.

## 2020-05-28 NOTE — Discharge Instructions (Signed)
RESUME PLAVIX & PLETAL ON Saturday 05/29/20  [facility] LAPAROSCOPIC SURGERY: POST OP INSTRUCTIONS Always review your discharge instruction sheet given to you by the facility where your surgery was performed. IF YOU HAVE DISABILITY OR FAMILY LEAVE FORMS, YOU MUST BRING THEM TO THE OFFICE FOR PROCESSING.   DO NOT GIVE THEM TO YOUR DOCTOR.  PAIN CONTROL  1. First take acetaminophen (Tylenol) AND/or ibuprofen (Advil) to control your pain after surgery.  Follow directions on package.  Taking acetaminophen (Tylenol) and/or ibuprofen (Advil) regularly after surgery will help to control your pain and lower the amount of prescription pain medication you may need.  You should not take more than 3,000 mg (3 grams) of acetaminophen (Tylenol) in 24 hours.  You should not take ibuprofen (Advil), aleve, motrin, naprosyn or other NSAIDS if you have a history of stomach ulcers or chronic kidney disease.  2. A prescription for pain medication may be given to you upon discharge.  Take your pain medication as prescribed, if you still have uncontrolled pain after taking acetaminophen (Tylenol) or ibuprofen (Advil). 3. Use ice packs to help control pain. 4. If you need a refill on your pain medication, please contact your pharmacy.  They will contact our office to request authorization. Prescriptions will not be filled after 5pm or on week-ends.  HOME MEDICATIONS 5. Take your usually prescribed medications unless otherwise directed.  DIET 6. You should follow a light diet the first few days after arrival home.  Be sure to include lots of fluids daily. Avoid fatty, fried foods.   CONSTIPATION 7. It is common to experience some constipation after surgery and if you are taking pain medication.  Increasing fluid intake and taking a stool softener (such as Colace) will usually help or prevent this problem from occurring.  A mild laxative (Milk of Magnesia or Miralax) should be taken according to  package instructions if there are no bowel movements after 48 hours.  WOUND/INCISION CARE 8. Most patients will experience some swelling and bruising in the area of the incisions.  Ice packs will help.  Swelling and bruising can take several days to resolve.  9. Unless discharge instructions indicate otherwise, follow guidelines below  a. STERI-STRIPS - you may remove your outer bandages 48 hours after surgery, and you may shower at that time.  You have steri-strips (small skin tapes) in place directly over the incision.  These strips should be left on the skin for 7-10 days.   b. DERMABOND/SKIN GLUE - you may shower in 24 hours.  The glue will flake off over the next 2-3 weeks. 10. Any sutures or staples will be removed at the office during your follow-up visit.  ACTIVITIES 11. You may resume regular (light) daily activities beginning the next day--such as daily self-care, walking, climbing stairs--gradually increasing activities as tolerated.  You may have sexual intercourse when it is comfortable.  Refrain from any heavy lifting or straining until approved by your doctor. a. You may drive when you are no longer taking prescription pain medication, you can comfortably wear a seatbelt, and you can safely maneuver your car and apply brakes.  FOLLOW-UP 12. You should see your doctor in the office for a follow-up appointment approximately 2-3 weeks after your surgery.  You should have been given your post-op/follow-up appointment when your surgery was scheduled.  If you did not receive a post-op/follow-up appointment, make sure that you call for this appointment within a day or two after you arrive home  to insure a convenient appointment time.  OTHER INSTRUCTIONS 13.   WHEN TO CALL YOUR DOCTOR: 1. Fever over 101.0 2. Inability to urinate 3. Continued bleeding from incision. 4. Increased pain, redness, or drainage from the incision. 5. Increasing abdominal pain  The clinic staff is available  to answer your questions during regular business hours.  Please don't hesitate to call and ask to speak to one of the nurses for clinical concerns.  If you have a medical emergency, go to the nearest emergency room or call 911.  A surgeon from [facility] is always on call at the hospital. [address] [place], [place], [place]  [address] ? [address], [place], [place]   [address] [phone] ? [phone] ? FAX [phone] Web site: [email or web address]

## 2020-05-28 NOTE — Discharge Summary (Signed)
Physician Discharge Summary  Catherine Crawford TMA:[id number] DOB: 04-09-1966 DOA: 05/27/2020  PCP: Patient, [name]  Admit date: 05/27/2020 Discharge date: 05/28/2020  Recommendations for Outpatient Follow-up:     Follow-up Information    Greer Pickerel, MD Follow up.   Specialty: General Surgery Contact information: [place] [address] [phone]              Discharge Diagnoses:  1. Right upper quadrant pain, probable chronic calculus cholecystitis 2. CAD 3. PAD 4. Obesity   Surgical Procedure: Laparoscopic cholecystectomy  Discharge Condition: Good Disposition: Home  Diet recommendation: Heart healthy  Filed Weights   05/27/20 1038  Weight: 100.5 kg     Hospital Course:  Patient was brought in for planned scheduled laparoscopic cholecystectomy for right upper quadrant pain and gallstones.  Please see operative note for additional details.  Because of her significant cardiac history she was kept overnight for observation on the telemetry unit.  On postop day 1 she was tolerating a diet.  She was ambulating without difficulty.  She had minimal pain and required no oral narcotics.  She had no nausea or vomiting.  She desired discharge.  She met discharge criteria.  Vital signs had remained stable.  We discussed discharge instructions  I recommended that she restart her Plavix and Pletal on Saturday  BP (!) 146/70 (BP Location: Left Arm)   Pulse 68   Temp 97.6 F (36.4 C) (Oral)   Resp 16   Ht '5\' 3"'  (1.6 m)   Wt 100.5 kg   SpO2 96%   BMI 39.25 kg/m   Gen: alert, NAD, non-toxic appearing Pupils: equal, no scleral icterus Pulm: Lungs clear to auscultation, symmetric chest rise CV: regular rate and rhythm Abd: soft, mild approp tender, nondistended. Some bruising around extraction site. No cellulitis. No incisional hernia Ext: no edema, no calf tenderness Skin: no rash, no jaundice    Discharge Instructions - resume plavix  and pletal on Saturday 05/29/20  Discharge Instructions    Call MD for:   Complete by: As directed    Temperature >101   Call MD for:  hives   Complete by: As directed    Call MD for:  persistant dizziness or light-headedness   Complete by: As directed    Call MD for:  persistant nausea and vomiting   Complete by: As directed    Call MD for:  redness, tenderness, or signs of infection (pain, swelling, redness, odor or green/yellow discharge around incision site)   Complete by: As directed    Call MD for:  severe uncontrolled pain   Complete by: As directed    Diet - low sodium heart healthy   Complete by: As directed    Discharge instructions   Complete by: As directed    See [facility] discharge instructions   Increase activity slowly   Complete by: As directed      Allergies as of 05/28/2020      Reactions   Bee Venom Anaphylaxis   Morphine And Related Nausea And Vomiting      Medication List    STOP taking these medications   acetaminophen 500 MG tablet Commonly known as: TYLENOL     TAKE these medications   aspirin EC 81 MG tablet Take 81 mg by mouth daily.   carvedilol 3.125 MG tablet Commonly known as: COREG TAKE 1 TABLET(3.125 MG) BY MOUTH TWICE DAILY What changed: See the new instructions.   cilostazol 100 MG  tablet Commonly known as: PLETAL TAKE 1 TABLET BY MOUTH TWICE DAILY   clopidogrel 75 MG tablet Commonly known as: PLAVIX Take 75 mg by mouth at bedtime.   ezetimibe 10 MG tablet Commonly known as: ZETIA TAKE 1 TABLET(10 MG) BY MOUTH DAILY What changed: See the new instructions.   Green Tea Tabs Take 1 tablet by mouth daily.   losartan 100 MG tablet Commonly known as: COZAAR TAKE 1 TABLET(100 MG) BY MOUTH DAILY What changed: See the new instructions.   Magnesium 250 MG Tabs Take 250 mg by mouth daily.   nitroGLYCERIN 0.4 MG SL tablet Commonly known as: NITROSTAT Place 1 tablet (0.4 mg total) under the tongue every 5 (five) minutes as  needed.   pantoprazole 40 MG tablet Commonly known as: PROTONIX TAKE 1 TABLET(40 MG) BY MOUTH DAILY What changed: See the new instructions.   rosuvastatin 40 MG tablet Commonly known as: CRESTOR TAKE 1 TABLET BY MOUTH DAILY What changed: when to take this   spironolactone 25 MG tablet Commonly known as: ALDACTONE TAKE ONE-HALF TABLET BY MOUTH TWICE DAILY What changed: when to take this   Suprep Bowel Prep Kit 17.5-3.13-1.6 GM/177ML Soln Generic drug: Na Sulfate-K Sulfate-Mg Sulf Take 1 kit by mouth as directed. Apply Coupon=BIN: [id number] PCN: CN GROUP: [id number] ID: [id number]   traMADol 50 MG tablet Commonly known as: ULTRAM Take 1-2 tablets (50-100 mg total) by mouth every 6 (six) hours as needed (breathru pain).   vitamin B-12 1000 MCG tablet Commonly known as: CYANOCOBALAMIN Take 1,000 mcg by mouth daily.   Vitamin C 500 MG Caps Take 1 capsule by mouth daily.       Follow-up Information    Greer Pickerel, MD Follow up.   Specialty: General Surgery Contact information: [place] [address] [phone]                The results of significant diagnostics from this hospitalization (including imaging, microbiology, ancillary and laboratory) are listed below for reference.    Significant Diagnostic Studies: MYOCARDIAL PERFUSION IMAGING  Result Date: 05/20/2020  Nuclear stress EF: 61%.  The left ventricular ejection fraction is normal (55-65%).  There was no ST segment deviation noted during stress.  The study is normal.  This is a low risk study.     Microbiology: Recent Results (from the past 240 hour(s))  SARS CORONAVIRUS 2 (TAT 6-24 HRS) Nasopharyngeal Nasopharyngeal Swab     Status: None   Collection Time: 05/24/20  1:55 PM   Specimen: Nasopharyngeal Swab  Result Value Ref Range Status   SARS Coronavirus 2 NEGATIVE NEGATIVE Final    Comment: (NOTE) SARS-CoV-2 target nucleic acids are NOT DETECTED.  The SARS-CoV-2  RNA is generally detectable in upper and lower respiratory specimens during the acute phase of infection. Negative results do not preclude SARS-CoV-2 infection, do not rule out co-infections with other pathogens, and should not be used as the sole basis for treatment or other patient management decisions. Negative results must be combined with clinical observations, patient history, and epidemiological information. The expected result is Negative.  Fact Sheet for Patients: SugarRoll.be  Fact Sheet for Healthcare Providers: https://www.woods-mathews.com/  This test is not yet approved or cleared by the [place] FDA and  has been authorized for detection and/or diagnosis of SARS-CoV-2 by FDA under an Emergency Use Authorization (EUA). This EUA will remain  in effect (meaning this test can be used) for the duration of the COVID-19 declaration under Se  ction 564(b)(1) of the Act, 21 U.S.C. section 360bbb-3(b)(1), unless the authorization is terminated or revoked sooner.  Performed at [facility], [place] [address] [place],  [address]      Labs: None  Active Problems:   Chronic cholecystitis   Time coordinating discharge: 15 min  Signed:  Gayland Curry, MD [facility], [place] [phone] 05/28/2020, 7:52 AM

## 2020-05-28 NOTE — Plan of Care (Signed)

## 2020-05-28 NOTE — Plan of Care (Signed)

## 2020-06-23 DIAGNOSIS — I739 Peripheral vascular disease, unspecified: Secondary | ICD-10-CM | POA: Diagnosis not present

## 2020-06-23 DIAGNOSIS — I10 Essential (primary) hypertension: Secondary | ICD-10-CM | POA: Diagnosis not present

## 2020-06-23 DIAGNOSIS — I252 Old myocardial infarction: Secondary | ICD-10-CM | POA: Diagnosis not present

## 2020-06-24 DIAGNOSIS — Z01812 Encounter for preprocedural laboratory examination: Secondary | ICD-10-CM | POA: Diagnosis not present

## 2020-07-13 ENCOUNTER — Telehealth: Payer: Self-pay | Admitting: Cardiology

## 2020-07-13 NOTE — Telephone Encounter (Signed)
New Message:    Pt wanted to be sure that Dr Bing Matter know that she is having Angioplasty on 07-28-20. She said Dr Smith Robert of [facility] will be doing the procedure in [facility]. If Dr Bing Matter wants to talk to her the procedure she would appreciate hearing from him.k

## 2020-07-14 NOTE — Telephone Encounter (Signed)
Okay, noted.  Hopefully she will do better with it

## 2020-07-19 ENCOUNTER — Other Ambulatory Visit: Payer: Self-pay | Admitting: Cardiology

## 2020-07-19 NOTE — Telephone Encounter (Signed)
Zetia and Losartan sent. Awaiting Dr. Agustin Cree approval for Plavix to be filled

## 2020-07-19 NOTE — Telephone Encounter (Signed)
Plavix sent per Dr. Agustin Cree.

## 2020-07-23 DIAGNOSIS — Z01812 Encounter for preprocedural laboratory examination: Secondary | ICD-10-CM | POA: Diagnosis not present

## 2020-07-28 DIAGNOSIS — I2510 Atherosclerotic heart disease of native coronary artery without angina pectoris: Secondary | ICD-10-CM | POA: Diagnosis not present

## 2020-07-28 DIAGNOSIS — I70213 Atherosclerosis of native arteries of extremities with intermittent claudication, bilateral legs: Secondary | ICD-10-CM | POA: Diagnosis not present

## 2020-07-28 DIAGNOSIS — I10 Essential (primary) hypertension: Secondary | ICD-10-CM | POA: Diagnosis not present

## 2020-07-28 DIAGNOSIS — T82856A Stenosis of peripheral vascular stent, initial encounter: Secondary | ICD-10-CM | POA: Diagnosis not present

## 2020-08-26 ENCOUNTER — Other Ambulatory Visit: Payer: Self-pay | Admitting: Cardiology

## 2020-08-30 NOTE — Telephone Encounter (Signed)
Rx refill sent to pharmacy. 

## 2020-10-01 ENCOUNTER — Ambulatory Visit: Payer: Medicare Other | Admitting: Cardiology

## 2020-11-01 ENCOUNTER — Other Ambulatory Visit: Payer: Self-pay | Admitting: Cardiology

## 2020-11-19 DIAGNOSIS — I739 Peripheral vascular disease, unspecified: Secondary | ICD-10-CM | POA: Insufficient documentation

## 2020-11-19 DIAGNOSIS — J449 Chronic obstructive pulmonary disease, unspecified: Secondary | ICD-10-CM | POA: Diagnosis not present

## 2020-11-19 DIAGNOSIS — I079 Rheumatic tricuspid valve disease, unspecified: Secondary | ICD-10-CM | POA: Insufficient documentation

## 2020-11-19 DIAGNOSIS — R944 Abnormal results of kidney function studies: Secondary | ICD-10-CM | POA: Diagnosis not present

## 2020-11-19 DIAGNOSIS — I2510 Atherosclerotic heart disease of native coronary artery without angina pectoris: Secondary | ICD-10-CM | POA: Diagnosis not present

## 2020-11-19 DIAGNOSIS — I10 Essential (primary) hypertension: Secondary | ICD-10-CM | POA: Diagnosis not present

## 2020-11-19 DIAGNOSIS — F32A Depression, unspecified: Secondary | ICD-10-CM | POA: Insufficient documentation

## 2020-11-19 DIAGNOSIS — I252 Old myocardial infarction: Secondary | ICD-10-CM | POA: Diagnosis not present

## 2020-11-19 DIAGNOSIS — Z87891 Personal history of nicotine dependence: Secondary | ICD-10-CM | POA: Diagnosis not present

## 2020-11-19 DIAGNOSIS — R7309 Other abnormal glucose: Secondary | ICD-10-CM | POA: Diagnosis not present

## 2020-11-19 DIAGNOSIS — I359 Nonrheumatic aortic valve disorder, unspecified: Secondary | ICD-10-CM | POA: Insufficient documentation

## 2020-11-19 DIAGNOSIS — Z0000 Encounter for general adult medical examination without abnormal findings: Secondary | ICD-10-CM | POA: Diagnosis not present

## 2020-11-19 DIAGNOSIS — Z7982 Long term (current) use of aspirin: Secondary | ICD-10-CM | POA: Diagnosis not present

## 2020-11-19 DIAGNOSIS — I219 Acute myocardial infarction, unspecified: Secondary | ICD-10-CM | POA: Insufficient documentation

## 2020-11-19 DIAGNOSIS — Z8673 Personal history of transient ischemic attack (TIA), and cerebral infarction without residual deficits: Secondary | ICD-10-CM | POA: Diagnosis not present

## 2020-11-19 DIAGNOSIS — I509 Heart failure, unspecified: Secondary | ICD-10-CM | POA: Insufficient documentation

## 2020-11-19 DIAGNOSIS — E782 Mixed hyperlipidemia: Secondary | ICD-10-CM | POA: Diagnosis not present

## 2020-11-19 DIAGNOSIS — Z0001 Encounter for general adult medical examination with abnormal findings: Secondary | ICD-10-CM | POA: Diagnosis not present

## 2020-11-19 DIAGNOSIS — C539 Malignant neoplasm of cervix uteri, unspecified: Secondary | ICD-10-CM | POA: Insufficient documentation

## 2020-11-19 DIAGNOSIS — Z7902 Long term (current) use of antithrombotics/antiplatelets: Secondary | ICD-10-CM | POA: Diagnosis not present

## 2020-11-19 DIAGNOSIS — E785 Hyperlipidemia, unspecified: Secondary | ICD-10-CM | POA: Insufficient documentation

## 2020-11-19 DIAGNOSIS — I8390 Asymptomatic varicose veins of unspecified lower extremity: Secondary | ICD-10-CM | POA: Insufficient documentation

## 2020-11-19 DIAGNOSIS — I110 Hypertensive heart disease with heart failure: Secondary | ICD-10-CM | POA: Diagnosis not present

## 2020-11-19 DIAGNOSIS — I429 Cardiomyopathy, unspecified: Secondary | ICD-10-CM | POA: Diagnosis not present

## 2020-11-19 DIAGNOSIS — I700 Atherosclerosis of aorta: Secondary | ICD-10-CM | POA: Diagnosis not present

## 2020-11-19 DIAGNOSIS — Z79899 Other long term (current) drug therapy: Secondary | ICD-10-CM | POA: Diagnosis not present

## 2020-11-19 DIAGNOSIS — I639 Cerebral infarction, unspecified: Secondary | ICD-10-CM | POA: Insufficient documentation

## 2020-11-23 ENCOUNTER — Telehealth: Payer: Self-pay | Admitting: Cardiology

## 2020-11-23 NOTE — Telephone Encounter (Signed)
Left message on patients voicemail to please return our call.   

## 2020-11-23 NOTE — Telephone Encounter (Signed)
Called patient. Her results are in care everywhere from recent blood work. She wants Dr. Agustin Cree to review and decide if she needs to adjust her spironolactone based on kidney function.

## 2020-11-23 NOTE — Telephone Encounter (Signed)
PT is returning Catherine Crawford's call.

## 2020-11-23 NOTE — Telephone Encounter (Signed)
New Message:   Pt wanted Dr Agustin Cree to know that she have a Primary doctor. Her Primary doctor is Dr Maxine Glenn at [facility]. She had blood work and Dr Soyla Murphy aid her kidney functions are off and her glucose was high. She wanted her to make Dr Agustin Cree. aware of this. She said pt is on Spironolactone and her medicine will need to be adjusted, because of her kidneys.

## 2020-11-24 DIAGNOSIS — Z1231 Encounter for screening mammogram for malignant neoplasm of breast: Secondary | ICD-10-CM | POA: Diagnosis not present

## 2020-11-25 NOTE — Telephone Encounter (Signed)
Patient informed of Dr. Krasowski's recommendations.  

## 2020-11-29 ENCOUNTER — Other Ambulatory Visit: Payer: Self-pay | Admitting: Cardiology

## 2020-12-20 ENCOUNTER — Telehealth: Payer: Self-pay | Admitting: Cardiology

## 2020-12-20 MED ORDER — NITROGLYCERIN 0.4 MG SL SUBL: 0.4000 mg | SUBLINGUAL_TABLET | SUBLINGUAL | 3 refills | Status: AC | PRN

## 2020-12-20 NOTE — Telephone Encounter (Signed)
Medication filled.  

## 2020-12-20 NOTE — Telephone Encounter (Signed)
*  STAT* If patient is at the pharmacy, call can be transferred to refill team.   1. Which medications need to be refilled? (please list name of each medication and dose if known)  nitroGLYCERIN (NITROSTAT) 0.4 MG SL tablet  2. Which pharmacy/location (including street and city if local pharmacy) is medication to be sent to? [facility] [phone] - [place], [address] AT [address]  3. Do they need a 30 day or 90 day supply?  Patient is requesting a small supply for emergencies. She states her remaining nitro is from 1 year ago and she assumes it may be expired by the time she needs it.

## 2021-01-11 ENCOUNTER — Other Ambulatory Visit: Payer: Self-pay | Admitting: Cardiology

## 2021-03-23 DIAGNOSIS — T8859XA Other complications of anesthesia, initial encounter: Secondary | ICD-10-CM | POA: Insufficient documentation

## 2021-03-24 ENCOUNTER — Ambulatory Visit: Payer: Medicare Other | Admitting: Cardiology

## 2021-03-24 ENCOUNTER — Encounter: Payer: Self-pay | Admitting: Cardiology

## 2021-03-24 ENCOUNTER — Other Ambulatory Visit: Payer: Self-pay

## 2021-03-24 VITALS — BP 132/74 | HR 103 | Ht 64.0 in | Wt 235.8 lb

## 2021-03-24 DIAGNOSIS — E785 Hyperlipidemia, unspecified: Secondary | ICD-10-CM

## 2021-03-24 DIAGNOSIS — F32A Depression, unspecified: Secondary | ICD-10-CM

## 2021-03-24 DIAGNOSIS — I255 Ischemic cardiomyopathy: Secondary | ICD-10-CM | POA: Diagnosis not present

## 2021-03-24 DIAGNOSIS — I700 Atherosclerosis of aorta: Secondary | ICD-10-CM

## 2021-03-24 DIAGNOSIS — I739 Peripheral vascular disease, unspecified: Secondary | ICD-10-CM | POA: Diagnosis not present

## 2021-03-24 DIAGNOSIS — I2510 Atherosclerotic heart disease of native coronary artery without angina pectoris: Secondary | ICD-10-CM

## 2021-03-24 NOTE — Patient Instructions (Signed)
Medication Instructions:  Your physician recommends that you continue on your current medications as directed. Please refer to the Current Medication list given to you today.   *If you need a refill on your cardiac medications before your next appointment, please call your pharmacy*   Lab Work: None If you have labs (blood work) drawn today and your tests are completely normal, you will receive your results only by: [place] (if you have MyChart) OR A paper copy in the mail If you have any lab test that is abnormal or we need to change your treatment, we will call you to review the results.   Testing/Procedures: Your physician has requested that you have a carotid duplex. This test is an ultrasound of the carotid arteries in your neck. It looks at blood flow through these arteries that supply the brain with blood. Allow one hour for this exam. There are no restrictions or special instructions.  Your physician has requested that you have a upper extremity arterial duplex. This test is an ultrasound of the arteries in the arms. It looks at arterial blood flow in the arms. Allow one hour for Upper Arterial scans. There are no restrictions or special instructions    Follow-Up: At [facility], you and your health needs are our priority.  As part of our continuing mission to provide you with exceptional heart care, we have created designated Provider Care Teams.  These Care Teams include your primary Cardiologist (physician) and Advanced Practice Providers (APPs -  Physician Assistants and Nurse Practitioners) who all work together to provide you with the care you need, when you need it.  We recommend signing up for the patient portal called "MyChart".  Sign up information is provided on this After Visit Summary.  MyChart is used to connect with patients for Virtual Visits (Telemedicine).  Patients are able to view lab/test results, encounter notes, upcoming appointments, etc.   Non-urgent messages can be sent to your provider as well.   To learn more about what you can do with MyChart, go to [email or web address].    Your next appointment:   5 month(s)  The format for your next appointment:   In Person  Provider:   Jenne Campus, MD   Other Instructions

## 2021-03-24 NOTE — Progress Notes (Signed)
Cardiology Office Note:    Date:  03/24/2021   ID:  Catherine Crawford, DOB [birth year], MRN [id number]  PCP:  Patient, [name] (Inactive)  Cardiologist:  Jenne Campus, MD    Referring MD: No ref. provider found   Chief Complaint  Patient presents with   Chest Pain    And spasm    History of Present Illness:    Catherine Crawford is a 55 y.o. female  with past medical history significant for myocardial infarction in 2018, she was also identified to have completely occluded distal abdominal aorta with claudications, does have dyslipidemia, ex-smoker, hypertension. She comes today to my [place].  Overall she is not doing well it looks like major issue is psychological issue of she is actually crying in my office she says she feels depressed likely she does not have suicidal idealization.  She complain of still having some claudications.  She abstain from smoking complained to have some arm pain that last for sometimes days symptoms however she got pain in her arms when she tried to work with her arm.  Concern of course is about potential vascular problem there.  She tried to be a little more active but had difficulty doing it because of claudications  Past Medical History:  Diagnosis Date   Aortic occlusion ([place]) 04/16/2018   AVD (aortic valve disease)    CAD (coronary artery disease)    Cardiomyopathy (HCC)    Cervical cancer (HCC)    CHF (congestive heart failure) (HCC)    Claudication in peripheral vascular disease ([place]) 02/19/2018   Completely occluded aorta distal to renal arteries   Complication of anesthesia    Woke up once during surgery   COPD (chronic obstructive pulmonary disease) ([place])    Coronary artery disease 02/19/2018   Status post PTCA and stenting of LAD with a 3/30 millimeters stent, stents to RCA 2.5/30 8 mm stent in October 2018 in the face of acute myocardial infarction   Depression    Dyslipidemia 02/19/2018   Essential hypertension 02/19/2018   GERD  (gastroesophageal reflux disease)    Heart attack ([place])    Hyperlipidemia    Hypertension    Late effect of cerebrovascular accident (CVA) 02/19/2018   With right side numbness as well as peripheral vision loss   PAD (peripheral artery disease) ([place])    Stroke ([place])    Tricuspid valve disorder    Varicosities of leg     Past Surgical History:  Procedure Laterality Date   ABDOMINAL HYSTERECTOMY  2002   CARDIAC CATHETERIZATION     CESAREAN SECTION     CHOLECYSTECTOMY N/A 05/27/2020   Procedure: LAPAROSCOPIC CHOLECYSTECTOMY;  Surgeon: Greer Pickerel, MD;  Location: [facility];  Service: General;  Laterality: N/A;   CORONARY ANGIOPLASTY WITH STENT PLACEMENT     HERNIA REPAIR  2004   hiatal hernia, double lower abdominal hernia   MULTIPLE TOOTH EXTRACTIONS     PERIPHERAL ARTERIAL STENT GRAFT     x 2    Current Medications: Current Meds  Medication Sig   Ascorbic Acid (VITAMIN C) 500 MG CAPS Take 1 capsule by mouth daily.   aspirin EC 81 MG tablet Take 81 mg by mouth daily.   carvedilol (COREG) 3.125 MG tablet TAKE 1 TABLET(3.125 MG) BY MOUTH TWICE DAILY (Patient taking differently: Take 3.125 mg by mouth 2 (two) times daily with a meal.)   cilostazol (PLETAL) 100 MG tablet TAKE 1 TABLET BY MOUTH TWICE DAILY (Patient taking differently: Take 100 mg  by mouth 2 (two) times daily.)   clopidogrel (PLAVIX) 75 MG tablet TAKE 1 TABLET(75 MG) BY MOUTH DAILY (Patient taking differently: Take 75 mg by mouth daily.)   CRANBERRY CONCENTRATE PO Take 1 tablet by mouth daily.   ezetimibe (ZETIA) 10 MG tablet TAKE 1 TABLET(10 MG) BY MOUTH DAILY (Patient taking differently: Take 10 mg by mouth.)   losartan (COZAAR) 100 MG tablet TAKE 1 TABLET(100 MG) BY MOUTH DAILY (Patient taking differently: Take 100 mg by mouth daily.)   Magnesium 250 MG TABS Take 250 mg by mouth daily.    Na Sulfate-K Sulfate-Mg Sulf (SUPREP BOWEL PREP KIT) 17.5-3.13-1.6 GM/177ML SOLN Take 1 kit by mouth as directed. Apply Coupon=BIN:  [id number] PCN: CN GROUP: [id number] ID: [id number]   nitroGLYCERIN (NITROSTAT) 0.4 MG SL tablet Place 1 tablet (0.4 mg total) under the tongue every 5 (five) minutes as needed. (Patient taking differently: Place 0.4 mg under the tongue every 5 (five) minutes as needed for chest pain.)   pantoprazole (PROTONIX) 40 MG tablet TAKE 1 TABLET(40 MG) BY MOUTH DAILY (Patient taking differently: Take 40 mg by mouth.)   rosuvastatin (CRESTOR) 40 MG tablet TAKE 1 TABLET BY MOUTH DAILY (Patient taking differently: Take 40 mg by mouth.)   spironolactone (ALDACTONE) 25 MG tablet TAKE 1/2 TABLET BY MOUTH TWICE DAILY (Patient taking differently: 12.5 mg daily.)   traMADol (ULTRAM) 50 MG tablet Take 1-2 tablets (50-100 mg total) by mouth every 6 (six) hours as needed (breathru pain). (Patient taking differently: Take 50-100 mg by mouth every 6 (six) hours as needed for moderate pain or severe pain (breathru pain).)   vitamin B-12 (CYANOCOBALAMIN) 1000 MCG tablet Take 1,000 mcg by mouth daily.     Allergies:   Bee venom, Morphine and related, and Other   Social History   Socioeconomic History   Marital status: Married    Spouse name: Not on file   Number of children: 2   Years of education: Not on file   Highest education level: Not on file  Occupational History   Occupation: disabled  Tobacco Use   Smoking status: Former    Packs/day: 2.00    Years: 37.00    Pack years: 74.00    Types: Cigarettes    Quit date: 04/13/2017    Years since quitting: 3.9   Smokeless tobacco: Never  Vaping Use   Vaping Use: Never used  Substance and Sexual Activity   Alcohol use: Not Currently   Drug use: Not Currently   Sexual activity: Not on file    Comment: Hysterectomy  Other Topics Concern   Not on file  Social History Narrative   One level with husband      Right handed      Caffeine none      Exercise - not really      Education 12th grade   Social Determinants of Health   Financial Resource Strain:  Not on file  Food Insecurity: Not on file  Transportation Needs: Not on file  Physical Activity: Not on file  Stress: Not on file  Social Connections: Not on file     Family History: The patient's family history includes AAA (abdominal aortic aneurysm) in her mother; Bone cancer in her maternal aunt; Brain cancer in her brother; Breast cancer in her maternal aunt; CAD in her brother, father, and mother; Cancer in her maternal aunt; Cervical cancer in her maternal aunt; Heart attack in her father and mother; Heart attack (age of onset: 39)  in her sister; Heart disease in her brother, father, mother, and sister; Liver cancer in her father; Lung cancer in her mother; Skin cancer in her maternal aunt; Uterine cancer in her maternal aunt. ROS:   Please see the history of present illness.    All 14 point review of systems negative except as described per history of present illness  EKGs/Labs/Other Studies Reviewed:      Recent Labs: 05/19/2020: ALT 16; BUN 19; Creatinine, Ser 1.05; Hemoglobin 12.4; Platelets 246; Potassium 4.6; Sodium 144  Recent Lipid Panel    Component Value Date/Time   CHOL 108 09/30/2019 1447   TRIG 60 09/30/2019 1447   HDL 49 09/30/2019 1447   CHOLHDL 2.2 09/30/2019 1447   LDLCALC 46 09/30/2019 1447    Physical Exam:    VS:  BP 132/74 (BP Location: Left Arm, Patient Position: Sitting)   Pulse (!) 103   Ht '5\' 4"'  (1.626 m)   Wt 235 lb 12.8 oz (107 kg)   SpO2 98%   BMI 40.47 kg/m     Wt Readings from Last 3 Encounters:  03/24/21 235 lb 12.8 oz (107 kg)  05/27/20 221 lb 9 oz (100.5 kg)  05/20/20 219 lb (99.3 kg)     GEN:  Well nourished, well developed in no acute distress HEENT: Normal NECK: No JVD; No carotid bruits LYMPHATICS: No lymphadenopathy CARDIAC: RRR, no murmurs, no rubs, no gallops RESPIRATORY:  Clear to auscultation without rales, wheezing or rhonchi  ABDOMEN: Soft, non-tender, non-distended MUSCULOSKELETAL:  No edema; No deformity  SKIN:  Warm and dry LOWER EXTREMITIES: no swelling NEUROLOGIC:  Alert and oriented x 3 PSYCHIATRIC:  Normal affect   ASSESSMENT:    1. Coronary artery disease involving native coronary artery of native heart without angina pectoris   2. Ischemic cardiomyopathy   3. Aortic occlusion (HCC)   4. Claudication in peripheral vascular disease ([place])   5. Dyslipidemia    PLAN:    In order of problems listed above:  Coronary disease stable from that point review stress test review was negative.  That stress test was done for clearance for surgery for her gallbladder.  She did have surgery uneventful however does not feel much better after that. History of ischemic cardiomyopathy ejection fraction to 50 to 55%.  On appropriate medication which I will continue. Peripheral vascular disease advanced.  She is on dual antiplatelet therapy which I will continue, she is also on statin which I will continue.  I will call her primary care physician to get fasting lipid profile. Dyslipidemia again we will call primary care physician to get fasting lipid profile Depression we will refer her to our psychotherapist.   Medication Adjustments/Labs and Tests Ordered: Current medicines are reviewed at length with the patient today.  Concerns regarding medicines are outlined above.  No orders of the defined types were placed in this encounter.  Medication changes: No orders of the defined types were placed in this encounter.   Signed, Park Liter, MD, [facility] 03/24/2021 9:38 AM    [place]

## 2021-04-05 ENCOUNTER — Ambulatory Visit (INDEPENDENT_AMBULATORY_CARE_PROVIDER_SITE_OTHER): Payer: Medicare Other

## 2021-04-05 ENCOUNTER — Other Ambulatory Visit: Payer: Self-pay

## 2021-04-05 DIAGNOSIS — I2510 Atherosclerotic heart disease of native coronary artery without angina pectoris: Secondary | ICD-10-CM | POA: Diagnosis not present

## 2021-04-05 DIAGNOSIS — I739 Peripheral vascular disease, unspecified: Secondary | ICD-10-CM

## 2021-04-08 ENCOUNTER — Other Ambulatory Visit: Payer: Self-pay | Admitting: Cardiology

## 2021-04-14 ENCOUNTER — Ambulatory Visit (INDEPENDENT_AMBULATORY_CARE_PROVIDER_SITE_OTHER): Payer: Medicare Other | Admitting: Psychologist

## 2021-04-14 ENCOUNTER — Other Ambulatory Visit: Payer: Self-pay

## 2021-04-14 DIAGNOSIS — F321 Major depressive disorder, single episode, moderate: Secondary | ICD-10-CM

## 2021-04-25 ENCOUNTER — Ambulatory Visit (INDEPENDENT_AMBULATORY_CARE_PROVIDER_SITE_OTHER): Payer: Medicare Other | Admitting: Psychologist

## 2021-04-25 DIAGNOSIS — F321 Major depressive disorder, single episode, moderate: Secondary | ICD-10-CM

## 2021-05-10 ENCOUNTER — Ambulatory Visit: Payer: Medicare Other | Admitting: Psychologist

## 2021-07-07 ENCOUNTER — Other Ambulatory Visit: Payer: Self-pay | Admitting: Cardiology

## 2021-07-07 ENCOUNTER — Other Ambulatory Visit: Payer: Self-pay | Admitting: Vascular Surgery

## 2021-07-07 DIAGNOSIS — I739 Peripheral vascular disease, unspecified: Secondary | ICD-10-CM

## 2021-07-07 NOTE — Telephone Encounter (Signed)
Not seen since 2020 - make/keep appt

## 2021-07-07 NOTE — Telephone Encounter (Signed)
Clopidogrel 75 mg # 90 x 1 refill Carvedilol 3.125 mg # 180 x 1 refill Rosuvastatin 40 mg # 90 x 1 refill Sent to  [facility] [id number] - [place], [place] - [address] AT [address]

## 2021-09-08 ENCOUNTER — Encounter: Payer: Self-pay | Admitting: Cardiology

## 2021-09-08 ENCOUNTER — Other Ambulatory Visit: Payer: Self-pay

## 2021-09-08 ENCOUNTER — Ambulatory Visit: Payer: Medicare Other | Admitting: Cardiology

## 2021-09-08 VITALS — BP 128/80 | HR 68 | Ht 64.0 in | Wt 229.0 lb

## 2021-09-08 DIAGNOSIS — I700 Atherosclerosis of aorta: Secondary | ICD-10-CM | POA: Diagnosis not present

## 2021-09-08 DIAGNOSIS — I255 Ischemic cardiomyopathy: Secondary | ICD-10-CM | POA: Diagnosis not present

## 2021-09-08 DIAGNOSIS — E782 Mixed hyperlipidemia: Secondary | ICD-10-CM | POA: Diagnosis not present

## 2021-09-08 DIAGNOSIS — I2510 Atherosclerotic heart disease of native coronary artery without angina pectoris: Secondary | ICD-10-CM

## 2021-09-08 NOTE — Progress Notes (Signed)
Cardiology Office Note:    Date:  09/08/2021   ID:  Catherine Crawford, DOB 08-23-1965, MRN [id number]  PCP:  Patient, [name] (Inactive)  Cardiologist:  Jenne Campus, MD    Referring MD: No ref. provider found   Chief Complaint  Patient presents with   Follow-up  I am doing fine cardiac wise  History of Present Illness:    Catherine Crawford is a 56 y.o. female with past medical history significant for coronary artery disease, status post myocardial infarction in 2018 at that time she was identified also to have completely occluded distal abdominal aorta with claudications.  Dyslipidemia, ex-smoker she quit smoking 5 years ago, hypertension, depression. She is coming to my office today for follow-up cardiac wise doing well.  Denies have any chest pain tightness squeezing pressure burning chest.  Trying to be active but have difficulty because of fatigue and tiredness.  Majority of problem again is psychological.  She was sent to psychotherapist but she did not like that person.  She does have depression that he suffered from depression for many years.  She does not have any suicidal idealization.  Overall she says she is doing slightly better from that aspect but still not good.  Past Medical History:  Diagnosis Date   Aortic occlusion ([place]) 04/16/2018   AVD (aortic valve disease)    CAD (coronary artery disease)    Cardiomyopathy (HCC)    Cervical cancer (HCC)    CHF (congestive heart failure) (HCC)    Claudication in peripheral vascular disease ([place]) 02/19/2018   Completely occluded aorta distal to renal arteries   Complication of anesthesia    Woke up once during surgery   COPD (chronic obstructive pulmonary disease) ([place])    Coronary artery disease 02/19/2018   Status post PTCA and stenting of LAD with a 3/30 millimeters stent, stents to RCA 2.5/30 8 mm stent in October 2018 in the face of acute myocardial infarction   Depression    Dyslipidemia 02/19/2018   Essential  hypertension 02/19/2018   GERD (gastroesophageal reflux disease)    Heart attack ([place])    Hyperlipidemia    Hypertension    Late effect of cerebrovascular accident (CVA) 02/19/2018   With right side numbness as well as peripheral vision loss   PAD (peripheral artery disease) ([place])    Stroke ([place])    Tricuspid valve disorder    Varicosities of leg     Past Surgical History:  Procedure Laterality Date   ABDOMINAL HYSTERECTOMY  2002   CARDIAC CATHETERIZATION     CESAREAN SECTION     CHOLECYSTECTOMY N/A 05/27/2020   Procedure: LAPAROSCOPIC CHOLECYSTECTOMY;  Surgeon: Greer Pickerel, MD;  Location: [facility];  Service: General;  Laterality: N/A;   CORONARY ANGIOPLASTY WITH STENT PLACEMENT     HERNIA REPAIR  2004   hiatal hernia, double lower abdominal hernia   MULTIPLE TOOTH EXTRACTIONS     PERIPHERAL ARTERIAL STENT GRAFT     x 2    Current Medications: Current Meds  Medication Sig   Ascorbic Acid (VITAMIN C) 500 MG CAPS Take 1 capsule by mouth daily.   aspirin EC 81 MG tablet Take 81 mg by mouth daily.   carvedilol (COREG) 3.125 MG tablet TAKE 1 TABLET(3.125 MG) BY MOUTH TWICE DAILY (Patient taking differently: Take 3.125 mg by mouth 2 (two) times daily with a meal.)   cilostazol (PLETAL) 100 MG tablet TAKE 1 TABLET BY MOUTH TWICE DAILY (Patient taking differently: Take 100 mg by mouth  2 (two) times daily.)   clopidogrel (PLAVIX) 75 MG tablet TAKE 1 TABLET(75 MG) BY MOUTH DAILY (Patient taking differently: Take 75 mg by mouth daily.)   CRANBERRY CONCENTRATE PO Take 1 tablet by mouth daily.   ezetimibe (ZETIA) 10 MG tablet TAKE 1 TABLET(10 MG) BY MOUTH DAILY (Patient taking differently: Take 10 mg by mouth daily.)   Magnesium 250 MG TABS Take 250 mg by mouth daily.    nitroGLYCERIN (NITROSTAT) 0.4 MG SL tablet Place 1 tablet (0.4 mg total) under the tongue every 5 (five) minutes as needed. (Patient taking differently: Place 0.4 mg under the tongue every 5 (five) minutes as needed for chest  pain.)   pantoprazole (PROTONIX) 40 MG tablet TAKE 1 TABLET(40 MG) BY MOUTH DAILY (Patient taking differently: Take 40 mg by mouth daily.)   rosuvastatin (CRESTOR) 40 MG tablet TAKE 1 TABLET BY MOUTH DAILY (Patient taking differently: Take 40 mg by mouth daily.)   spironolactone (ALDACTONE) 25 MG tablet TAKE 1/2 TABLET BY MOUTH TWICE DAILY (Patient taking differently: Take 12.5 mg by mouth 2 (two) times daily.)   traMADol (ULTRAM) 50 MG tablet Take 1-2 tablets (50-100 mg total) by mouth every 6 (six) hours as needed (breathru pain). (Patient taking differently: Take 50-100 mg by mouth every 6 (six) hours as needed for moderate pain or severe pain (breathru pain).)   vitamin B-12 (CYANOCOBALAMIN) 1000 MCG tablet Take 1,000 mcg by mouth daily.     Allergies:   Bee venom, Morphine and related, and Other   Social History   Socioeconomic History   Marital status: Married    Spouse name: Not on file   Number of children: 2   Years of education: Not on file   Highest education level: Not on file  Occupational History   Occupation: disabled  Tobacco Use   Smoking status: Former    Packs/day: 2.00    Years: 37.00    Pack years: 74.00    Types: Cigarettes    Quit date: 04/13/2017    Years since quitting: 4.4   Smokeless tobacco: Never  Vaping Use   Vaping Use: Never used  Substance and Sexual Activity   Alcohol use: Not Currently   Drug use: Not Currently   Sexual activity: Not on file    Comment: Hysterectomy  Other Topics Concern   Not on file  Social History Narrative   One level with husband      Right handed      Caffeine none      Exercise - not really      Education 12th grade   Social Determinants of Health   Financial Resource Strain: Not on file  Food Insecurity: Not on file  Transportation Needs: Not on file  Physical Activity: Not on file  Stress: Not on file  Social Connections: Not on file     Family History: The patient's family history includes AAA  (abdominal aortic aneurysm) in her mother; Bone cancer in her maternal aunt; Brain cancer in her brother; Breast cancer in her maternal aunt; CAD in her brother, father, and mother; Cancer in her maternal aunt; Cervical cancer in her maternal aunt; Heart attack in her father and mother; Heart attack (age of onset: 65) in her sister; Heart disease in her brother, father, mother, and sister; Liver cancer in her father; Lung cancer in her mother; Skin cancer in her maternal aunt; Uterine cancer in her maternal aunt. ROS:   Please see the history of present illness.  All 14 point review of systems negative except as described per history of present illness  EKGs/Labs/Other Studies Reviewed:      Recent Labs: No results found for requested labs within last 8760 hours.  Recent Lipid Panel    Component Value Date/Time   CHOL 108 09/30/2019 1447   TRIG 60 09/30/2019 1447   HDL 49 09/30/2019 1447   CHOLHDL 2.2 09/30/2019 1447   LDLCALC 46 09/30/2019 1447    Physical Exam:    VS:  BP 128/80 (BP Location: Right Arm, Patient Position: Sitting)    Pulse 68    Ht 5\' 4"  (1.626 m)    Wt 229 lb (103.9 kg)    SpO2 94%    BMI 39.31 kg/m     Wt Readings from Last 3 Encounters:  09/08/21 229 lb (103.9 kg)  03/24/21 235 lb 12.8 oz (107 kg)  05/27/20 221 lb 9 oz (100.5 kg)     GEN:  Well nourished, well developed in no acute distress HEENT: Normal NECK: No JVD; No carotid bruits LYMPHATICS: No lymphadenopathy CARDIAC: RRR, no murmurs, no rubs, no gallops RESPIRATORY:  Clear to auscultation without rales, wheezing or rhonchi  ABDOMEN: Soft, non-tender, non-distended MUSCULOSKELETAL:  No edema; No deformity  SKIN: Warm and dry LOWER EXTREMITIES: no swelling NEUROLOGIC:  Alert and oriented x 3 PSYCHIATRIC:  Normal affect   ASSESSMENT:    1. Coronary artery disease involving native coronary artery of native heart without angina pectoris   2. Ischemic cardiomyopathy   3. Aortic occlusion (HCC)    4. Mixed hyperlipidemia    PLAN:    In order of problems listed above:  Coronary disease stable from that point review on antiplatelet therapy which I will continue. Ischemic cardiomyopathy I will repeat echocardiogram to check left ventricle ejection fraction.  She is on Coreg as well as Cozaar and Aldactone. Aortic occlusion.  Some claudication of breath she is functioning quite well with that. Mixed dyslipidemia I did review K PN which show me data from March 2021 with LDL of 46 HDL 49, we will repeat the test.   Medication Adjustments/Labs and Tests Ordered: Current medicines are reviewed at length with the patient today.  Concerns regarding medicines are outlined above.  No orders of the defined types were placed in this encounter.  Medication changes: No orders of the defined types were placed in this encounter.   Signed, Park Liter, MD, [facility] 09/08/2021 10:11 AM    [place]

## 2021-09-08 NOTE — Addendum Note (Signed)
Addended by: Edwyna Shell I on: 09/08/2021 10:23 AM ? ? Modules accepted: Orders ? ?

## 2021-09-08 NOTE — Patient Instructions (Signed)
Medication Instructions:  ?Your physician recommends that you continue on your current medications as directed. Please refer to the Current Medication list given to you today. ? ?*If you need a refill on your cardiac medications before your next appointment, please call your pharmacy* ? ? ?Lab Work: ?Your physician recommends that you return for lab work in:  ? ?Labs today: CMP, Lipid ? ?If you have labs (blood work) drawn today and your tests are completely normal, you will receive your results only by: ?MyChart Message (if you have MyChart) OR ?A paper copy in the mail ?If you have any lab test that is abnormal or we need to change your treatment, we will call you to review the results. ? ? ?Testing/Procedures: ?Your physician has requested that you have an echocardiogram. Echocardiography is a painless test that uses sound waves to create images of your heart. It provides your doctor with information about the size and shape of your heart and how well your heart?s chambers and valves are working. This procedure takes approximately one hour. There are no restrictions for this procedure. ? ? ? ?Follow-Up: ?At [facility], you and your health needs are our priority.  As part of our continuing mission to provide you with exceptional heart care, we have created designated Provider Care Teams.  These Care Teams include your primary Cardiologist (physician) and Advanced Practice Providers (APPs -  Physician Assistants and Nurse Practitioners) who all work together to provide you with the care you need, when you need it. ? ?We recommend signing up for the patient portal called "MyChart".  Sign up information is provided on this After Visit Summary.  MyChart is used to connect with patients for Virtual Visits (Telemedicine).  Patients are able to view lab/test results, encounter notes, upcoming appointments, etc.  Non-urgent messages can be sent to your provider as well.   ?To learn more about what you can do with  MyChart, go to [email or web address].   ? ?Your next appointment:   ?6 month(s) ? ?The format for your next appointment:   ?In Person ? ?Provider:   ?Jenne Campus, MD  ? ? ?Other Instructions ?None ? ?

## 2021-09-09 LAB — COMPREHENSIVE METABOLIC PANEL
ALT: 28 IU/L (ref 0–32)
AST: 23 IU/L (ref 0–40)
Albumin/Globulin Ratio: 1.5 (ref 1.2–2.2)
Albumin: 4.4 g/dL (ref 3.8–4.9)
Alkaline Phosphatase: 97 IU/L (ref 44–121)
BUN/Creatinine Ratio: 9 (ref 9–23)
BUN: 10 mg/dL (ref 6–24)
Bilirubin Total: 0.4 mg/dL (ref 0.0–1.2)
CO2: 27 mmol/L (ref 20–29)
Calcium: 9.6 mg/dL (ref 8.7–10.2)
Chloride: 103 mmol/L (ref 96–106)
Creatinine, Ser: 1.15 mg/dL — ABNORMAL HIGH (ref 0.57–1.00)
Globulin, Total: 2.9 g/dL (ref 1.5–4.5)
Glucose: 110 mg/dL — ABNORMAL HIGH (ref 70–99)
Potassium: 4.6 mmol/L (ref 3.5–5.2)
Sodium: 141 mmol/L (ref 134–144)
Total Protein: 7.3 g/dL (ref 6.0–8.5)
eGFR: 55 mL/min/{1.73_m2} — ABNORMAL LOW (ref >59)

## 2021-09-09 LAB — LIPID PANEL
Chol/HDL Ratio: 3.4 ratio (ref 0.0–4.4)
Cholesterol, Total: 179 mg/dL (ref 100–199)
HDL: 52 mg/dL (ref >39)
LDL Chol Calc (NIH): 99 mg/dL (ref 0–99)
Triglycerides: 159 mg/dL — ABNORMAL HIGH (ref 0–149)
VLDL Cholesterol Cal: 28 mg/dL (ref 5–40)

## 2021-09-13 ENCOUNTER — Other Ambulatory Visit: Payer: Self-pay

## 2021-09-13 ENCOUNTER — Ambulatory Visit (INDEPENDENT_AMBULATORY_CARE_PROVIDER_SITE_OTHER): Payer: Medicare Other

## 2021-09-13 DIAGNOSIS — I700 Atherosclerosis of aorta: Secondary | ICD-10-CM | POA: Diagnosis not present

## 2021-09-13 DIAGNOSIS — E782 Mixed hyperlipidemia: Secondary | ICD-10-CM

## 2021-09-13 DIAGNOSIS — I2510 Atherosclerotic heart disease of native coronary artery without angina pectoris: Secondary | ICD-10-CM

## 2021-09-13 DIAGNOSIS — I255 Ischemic cardiomyopathy: Secondary | ICD-10-CM

## 2021-09-13 LAB — ECHOCARDIOGRAM COMPLETE
Area-P 1/2: 13.55 cm2
P 1/2 time: 425 msec
S' Lateral: 3.1 cm

## 2021-09-14 ENCOUNTER — Telehealth: Payer: Self-pay | Admitting: Cardiology

## 2021-09-14 NOTE — Telephone Encounter (Signed)
Follow Up: ? ? ? ? ?Patient is returning call, she says  if she does not answer, please leave the results on her voicemail. ?

## 2021-09-14 NOTE — Telephone Encounter (Signed)
Patient is returning CMA's call for lab results.  ?

## 2021-09-14 NOTE — Telephone Encounter (Signed)
Patient requested a detailed message be left on her VM with the results if she does not answer.  ?

## 2021-09-14 NOTE — Telephone Encounter (Signed)
Results reviewed with pt as per Dr. Wendy Poet note.  Pt verbalized understanding and had no additional questio ?

## 2021-10-01 ENCOUNTER — Other Ambulatory Visit: Payer: Self-pay | Admitting: Cardiology

## 2021-11-17 ENCOUNTER — Other Ambulatory Visit: Payer: Self-pay | Admitting: Cardiology

## 2021-11-17 DIAGNOSIS — I739 Peripheral vascular disease, unspecified: Secondary | ICD-10-CM

## 2021-12-20 ENCOUNTER — Other Ambulatory Visit: Payer: Self-pay | Admitting: Cardiology

## 2022-01-02 ENCOUNTER — Telehealth: Payer: Self-pay | Admitting: Cardiology

## 2022-01-02 DIAGNOSIS — I10 Essential (primary) hypertension: Secondary | ICD-10-CM

## 2022-01-02 NOTE — Telephone Encounter (Signed)
LVM to call regarding message left.

## 2022-01-11 ENCOUNTER — Telehealth: Payer: Self-pay | Admitting: Cardiology

## 2022-01-11 NOTE — Telephone Encounter (Signed)
Left message for the patient to call back.

## 2022-01-11 NOTE — Telephone Encounter (Signed)
Pt calling to f/u as requested. She would like a call bak regarding what she is suppose to do next. ( Refer to 6/26 notes). Please advise

## 2022-01-12 ENCOUNTER — Telehealth: Payer: Self-pay

## 2022-01-12 NOTE — Telephone Encounter (Signed)
Spoke with pt. She stated that she stopped her Spironolactone and was wondering what she should do next. Advised that Dr. Agustin Cree wanted a BMP in 1 week after stopping her medication. She stated that her blood pressure readings are in the "normal" range. She stated that she feels a little tired if her blood pressure is lower. Encouraged her to drink adequate amounts of fluids daily, monitor her BP readings and her weights. She verbalized understanding and had no further questions or concerns.

## 2022-01-12 NOTE — Telephone Encounter (Signed)
Pt returning a call from Delfino Lovett, Therapist, sports. Informed pt that Delfino Lovett is not in today. Pt asked that you leave a detailed message on her VM if she doesn't answer on what to do next about her BP issue, whether she needs to come in for an appt or labs and what time.

## 2022-01-16 ENCOUNTER — Telehealth: Payer: Self-pay | Admitting: Cardiology

## 2022-01-16 NOTE — Telephone Encounter (Signed)
Pt stated that she got blood work this morning. She reported that her blood pressure readings ar in the 90-110 over 56-60's. She stated that she feels tires and nauseous when her blood pressure is a little lower. Encouraged her to continue her fluids, eating well and a blood pressure log and when Dr. Agustin Cree gets her lab results we will call her. She agreed and verbalized understanding.

## 2022-01-16 NOTE — Telephone Encounter (Signed)
Patient came in office today for labs. She stated her blood pressure has been very low for the last few days. I have scheduled her for an appointment on 02/09/22. Please advise.

## 2022-01-17 LAB — BASIC METABOLIC PANEL
BUN/Creatinine Ratio: 11 (ref 9–23)
BUN: 13 mg/dL (ref 6–24)
CO2: 25 mmol/L (ref 20–29)
Calcium: 9.3 mg/dL (ref 8.7–10.2)
Chloride: 104 mmol/L (ref 96–106)
Creatinine, Ser: 1.15 mg/dL — ABNORMAL HIGH (ref 0.57–1.00)
Glucose: 98 mg/dL (ref 70–99)
Potassium: 4.2 mmol/L (ref 3.5–5.2)
Sodium: 141 mmol/L (ref 134–144)
eGFR: 55 mL/min/{1.73_m2} — ABNORMAL LOW (ref >59)

## 2022-01-19 ENCOUNTER — Telehealth: Payer: Self-pay

## 2022-01-19 ENCOUNTER — Other Ambulatory Visit: Payer: Self-pay | Admitting: Cardiology

## 2022-01-19 NOTE — Telephone Encounter (Signed)
Results reviewed with pt as per Dr. Krasowski's note.  Pt verbalized understanding and had no additional questions. Routed to PCP  

## 2022-01-20 ENCOUNTER — Telehealth: Payer: Self-pay | Admitting: Cardiology

## 2022-01-20 NOTE — Telephone Encounter (Signed)
We cannot accept medications in the office. I placed call to patient and left message to call office.

## 2022-01-20 NOTE — Telephone Encounter (Signed)
Patient notified

## 2022-01-20 NOTE — Telephone Encounter (Signed)
New Message:     Patient called and said she no longer take these medicine, Spironolactone, Clopidogrel and Rosuvastatin. She says she have bottles she have not open. She wonder if she could bing them to the office and let some patients that can not afford it, have it?

## 2022-02-09 ENCOUNTER — Ambulatory Visit: Payer: Medicare HMO | Admitting: Cardiology

## 2022-02-09 ENCOUNTER — Encounter: Payer: Self-pay | Admitting: Cardiology

## 2022-02-09 VITALS — BP 134/80 | HR 80 | Ht 64.0 in | Wt 234.4 lb

## 2022-02-09 DIAGNOSIS — I2510 Atherosclerotic heart disease of native coronary artery without angina pectoris: Secondary | ICD-10-CM | POA: Diagnosis not present

## 2022-02-09 DIAGNOSIS — I255 Ischemic cardiomyopathy: Secondary | ICD-10-CM | POA: Diagnosis not present

## 2022-02-09 DIAGNOSIS — I700 Atherosclerosis of aorta: Secondary | ICD-10-CM | POA: Diagnosis not present

## 2022-02-09 DIAGNOSIS — I10 Essential (primary) hypertension: Secondary | ICD-10-CM | POA: Diagnosis not present

## 2022-02-09 DIAGNOSIS — I739 Peripheral vascular disease, unspecified: Secondary | ICD-10-CM

## 2022-02-09 DIAGNOSIS — E782 Mixed hyperlipidemia: Secondary | ICD-10-CM

## 2022-02-09 MED ORDER — EZETIMIBE 10 MG PO TABS
10.0000 mg | ORAL_TABLET | Freq: Every day | ORAL | 3 refills | Status: DC
Start: 2022-02-09 — End: 2023-01-26

## 2022-02-09 MED ORDER — CILOSTAZOL 100 MG PO TABS: 100.0000 mg | ORAL_TABLET | Freq: Two times a day (BID) | ORAL | 6 refills | Status: DC

## 2022-02-09 NOTE — Patient Instructions (Addendum)
Medication Instructions:  Your physician recommends that you continue on your current medications as directed. Please refer to the Current Medication list given to you today.  *If you need a refill on your cardiac medications before your next appointment, please call your pharmacy*   Lab Work: Lipid, AST, ALT- Today If you have labs (blood work) drawn today and your tests are completely normal, you will receive your results only by: MyChart Message (if you have MyChart) OR A paper copy in the mail If you have any lab test that is abnormal or we need to change your treatment, we will call you to review the results.   Testing/Procedures: None Ordered   Follow-Up: At [facility], you and your health needs are our priority.  As part of our continuing mission to provide you with exceptional heart care, we have created designated Provider Care Teams.  These Care Teams include your primary Cardiologist (physician) and Advanced Practice Providers (APPs -  Physician Assistants and Nurse Practitioners) who all work together to provide you with the care you need, when you need it.  We recommend signing up for the patient portal called "MyChart".  Sign up information is provided on this After Visit Summary.  MyChart is used to connect with patients for Virtual Visits (Telemedicine).  Patients are able to view lab/test results, encounter notes, upcoming appointments, etc.  Non-urgent messages can be sent to your provider as well.   To learn more about what you can do with MyChart, go to [email or web address].    Your next appointment:   6 month(s)  The format for your next appointment:   In Person  Provider:   Robert Krasowski, MD    Other Instructions NA  

## 2022-02-09 NOTE — Progress Notes (Signed)
Cardiology Office Note:    Date:  02/09/2022   ID:  Catherine Crawford, DOB Apr 03, 1966, MRN [id number]  PCP:  Patient, [name]  Cardiologist:  Jenne Campus, MD    Referring MD: No ref. provider found   Chief Complaint  Patient presents with   Fall     02/06/22. Concern of blood clot, on blood thinner    History of Present Illness:    Catherine Crawford is a 56 y.o. female with past medical history significant for coronary artery disease, status post myocardial infarction 2018 at that time she was also identified to have completely occluded distal aorta with some claudications.  Additional problems include dyslipidemia, she is an ex-smoker quit smoking 5 years ago, essential hypertension.  She is in my office today for follow-up.  Overall she is doing fine she fell down few days ago after deck fell under her she had some bruises in the arm as well as on her leg.  She was terrified that can lead to blood clogging I have told her this is not the case.  She is relieved by that.  Denies have any chest pain tightness squeezing pressure burning chest shortness of breath fatigue is there.  She describes of days that she feels very weak tired exhausted at that time usually blood pressure slightly below.  Past Medical History:  Diagnosis Date   Aortic occlusion ([place]) 04/16/2018   AVD (aortic valve disease)    CAD (coronary artery disease)    Cardiomyopathy (HCC)    Cervical cancer (HCC)    CHF (congestive heart failure) (HCC)    Claudication in peripheral vascular disease ([place]) 02/19/2018   Completely occluded aorta distal to renal arteries   Complication of anesthesia    Woke up once during surgery   COPD (chronic obstructive pulmonary disease) ([place])    Coronary artery disease 02/19/2018   Status post PTCA and stenting of LAD with a 3/30 millimeters stent, stents to RCA 2.5/30 8 mm stent in October 2018 in the face of acute myocardial infarction   Depression    Dyslipidemia 02/19/2018    Essential hypertension 02/19/2018   GERD (gastroesophageal reflux disease)    Heart attack ([place])    Hyperlipidemia    Hypertension    Late effect of cerebrovascular accident (CVA) 02/19/2018   With right side numbness as well as peripheral vision loss   PAD (peripheral artery disease) ([place])    Stroke ([place])    Tricuspid valve disorder    Varicosities of leg     Past Surgical History:  Procedure Laterality Date   ABDOMINAL HYSTERECTOMY  2002   CARDIAC CATHETERIZATION     CESAREAN SECTION     CHOLECYSTECTOMY N/A 05/27/2020   Procedure: LAPAROSCOPIC CHOLECYSTECTOMY;  Surgeon: Greer Pickerel, MD;  Location: [facility];  Service: General;  Laterality: N/A;   CORONARY ANGIOPLASTY WITH STENT PLACEMENT     HERNIA REPAIR  2004   hiatal hernia, double lower abdominal hernia   MULTIPLE TOOTH EXTRACTIONS     PERIPHERAL ARTERIAL STENT GRAFT     x 2    Current Medications: Current Meds  Medication Sig   Ascorbic Acid (VITAMIN C) 500 MG CAPS Take 1 capsule by mouth daily.   aspirin EC 81 MG tablet Take 81 mg by mouth daily.   carvedilol (COREG) 3.125 MG tablet Take 1 tablet (3.125 mg total) by mouth 2 (two) times daily with a meal.   cilostazol (PLETAL) 100 MG tablet TAKE 1 TABLET BY MOUTH TWICE  DAILY (Patient taking differently: Take 100 mg by mouth 2 (two) times daily.)   clopidogrel (PLAVIX) 75 MG tablet Take 1 tablet (75 mg total) by mouth daily.   CRANBERRY CONCENTRATE PO Take 1 tablet by mouth daily.   ezetimibe (ZETIA) 10 MG tablet TAKE 1 TABLET(10 MG) BY MOUTH DAILY (Patient taking differently: Take 10 mg by mouth daily.)   losartan (COZAAR) 100 MG tablet TAKE 1 TABLET(100 MG) BY MOUTH DAILY (Patient taking differently: Take 100 mg by mouth daily.)   Magnesium 250 MG TABS Take 250 mg by mouth daily.    nitroGLYCERIN (NITROSTAT) 0.4 MG SL tablet Place 1 tablet (0.4 mg total) under the tongue every 5 (five) minutes as needed. (Patient taking differently: Place 0.4 mg under the tongue every 5  (five) minutes as needed for chest pain.)   pantoprazole (PROTONIX) 40 MG tablet Take 1 tablet (40 mg total) by mouth daily.   rosuvastatin (CRESTOR) 40 MG tablet TAKE 1 TABLET BY MOUTH DAILY (Patient taking differently: Take 40 mg by mouth daily.)   spironolactone (ALDACTONE) 25 MG tablet TAKE 1/2 TABLET BY MOUTH TWICE DAILY (Patient taking differently: Take 12.5 mg by mouth 2 (two) times daily.)   vitamin B-12 (CYANOCOBALAMIN) 1000 MCG tablet Take 1,000 mcg by mouth daily.   [DISCONTINUED] traMADol (ULTRAM) 50 MG tablet Take 1-2 tablets (50-100 mg total) by mouth every 6 (six) hours as needed (breathru pain). (Patient taking differently: Take 50-100 mg by mouth every 6 (six) hours as needed for moderate pain or severe pain (breathru pain).)     Allergies:   Bee venom, Morphine and related, and Other   Social History   Socioeconomic History   Marital status: Married    Spouse name: Not on file   Number of children: 2   Years of education: Not on file   Highest education level: Not on file  Occupational History   Occupation: disabled  Tobacco Use   Smoking status: Former    Packs/day: 2.00    Years: 37.00    Total pack years: 74.00    Types: Cigarettes    Quit date: 04/13/2017    Years since quitting: 4.8   Smokeless tobacco: Never  Vaping Use   Vaping Use: Never used  Substance and Sexual Activity   Alcohol use: Not Currently   Drug use: Not Currently   Sexual activity: Not on file    Comment: Hysterectomy  Other Topics Concern   Not on file  Social History Narrative   One level with husband      Right handed      Caffeine none      Exercise - not really      Education 12th grade   Social Determinants of Health   Financial Resource Strain: Not on file  Food Insecurity: Not on file  Transportation Needs: Not on file  Physical Activity: Not on file  Stress: Not on file  Social Connections: Not on file     Family History: The patient's family history includes  AAA (abdominal aortic aneurysm) in her mother; Bone cancer in her maternal aunt; Brain cancer in her brother; Breast cancer in her maternal aunt; CAD in her brother, father, and mother; Cancer in her maternal aunt; Cervical cancer in her maternal aunt; Heart attack in her father and mother; Heart attack (age of onset: 53) in her sister; Heart disease in her brother, father, mother, and sister; Liver cancer in her father; Lung cancer in her mother; Skin cancer in  her maternal aunt; Uterine cancer in her maternal aunt. ROS:   Please see the history of present illness.    All 14 point review of systems negative except as described per history of present illness  EKGs/Labs/Other Studies Reviewed:      Recent Labs: 09/08/2021: ALT 28 01/16/2022: BUN 13; Creatinine, Ser 1.15; Potassium 4.2; Sodium 141  Recent Lipid Panel    Component Value Date/Time   CHOL 179 09/08/2021 1027   TRIG 159 (H) 09/08/2021 1027   HDL 52 09/08/2021 1027   CHOLHDL 3.4 09/08/2021 1027   LDLCALC 99 09/08/2021 1027    Physical Exam:    VS:  BP 134/80 (BP Location: Right Arm, Patient Position: Sitting)   Pulse 80   Ht '5\' 4"'$  (1.626 m)   Wt 234 lb 6.4 oz (106.3 kg)   SpO2 92%   BMI 40.23 kg/m     Wt Readings from Last 3 Encounters:  02/09/22 234 lb 6.4 oz (106.3 kg)  09/08/21 229 lb (103.9 kg)  03/24/21 235 lb 12.8 oz (107 kg)     GEN:  Well nourished, well developed in no acute distress HEENT: Normal NECK: No JVD; No carotid bruits LYMPHATICS: No lymphadenopathy CARDIAC: RRR, no murmurs, no rubs, no gallops RESPIRATORY:  Clear to auscultation without rales, wheezing or rhonchi  ABDOMEN: Soft, non-tender, non-distended MUSCULOSKELETAL:  No edema; No deformity  SKIN: Warm and dry LOWER EXTREMITIES: no swelling NEUROLOGIC:  Alert and oriented x 3 PSYCHIATRIC:  Normal affect   ASSESSMENT:    1. Coronary artery disease involving native coronary artery of native heart without angina pectoris   2. Ischemic  cardiomyopathy   3. Essential hypertension   4. Aortic occlusion (HCC)   5. Primary hypertension   6. Mixed hyperlipidemia    PLAN:    In order of problems listed above:  Coronary disease stable from that point review on appropriate medication which include dual antiplatelets therapy we will continue present management. History of cardiomyopathy however last echocardiogram done in March showed ejection fraction 6065%.  We will continue present management. Essential hypertension blood pressure seems to well controlled continue present management. Dyslipidemia I did review her K PN which show me her LDL of 99 HDL 52 this is from March of this year we will redo the test. Peripheral vascular disease with aortic occlusion.  Claudication is being managed with medication she is doing quite well with Pletal   Medication Adjustments/Labs and Tests Ordered: Current medicines are reviewed at length with the patient today.  Concerns regarding medicines are outlined above.  No orders of the defined types were placed in this encounter.  Medication changes: No orders of the defined types were placed in this encounter.   Signed, Park Liter, MD, [facility] 02/09/2022 9:51 AM    [place]

## 2022-02-13 ENCOUNTER — Telehealth: Payer: Self-pay | Admitting: Cardiology

## 2022-02-13 NOTE — Telephone Encounter (Signed)
Patient came in office for labs, she stated she would like to let Dr. Raliegh Ip know that when she left from her appointment last Thursday, 02/09/22, she started having chest pains so she pulled over and took nitro. She said she had to take another nitro around 9:00 PM that same day. Her BP that evening was 168/114.

## 2022-02-13 NOTE — Telephone Encounter (Signed)
Pt stated that her cp is not steady and has not reoccured since 08/03. Pt stated the pain started in her chest and wrapped around her left upper back. Pt did not go to the ED for evaluation.  Pt stated that bp is back to normal and denies any SOB, nausea, and light headedness.  Please advise

## 2022-02-13 NOTE — Telephone Encounter (Signed)
Patient returning call.

## 2022-02-13 NOTE — Telephone Encounter (Signed)
Left a message for patient to call office back regarding CP.

## 2022-02-14 LAB — AST: AST: 14 IU/L (ref 0–40)

## 2022-02-14 LAB — LIPID PANEL
Chol/HDL Ratio: 3.5 ratio (ref 0.0–4.4)
Cholesterol, Total: 170 mg/dL (ref 100–199)
HDL: 48 mg/dL (ref >39)
LDL Chol Calc (NIH): 96 mg/dL (ref 0–99)
Triglycerides: 146 mg/dL (ref 0–149)
VLDL Cholesterol Cal: 26 mg/dL (ref 5–40)

## 2022-02-14 LAB — ALT: ALT: 16 IU/L (ref 0–32)

## 2022-02-14 NOTE — Telephone Encounter (Signed)
Patient is returning call. She requested a detailed voicemail when her call is returned.

## 2022-02-14 NOTE — Telephone Encounter (Signed)
Left a message

## 2022-02-14 NOTE — Telephone Encounter (Signed)
Pt stated that she is having blood pressure readings up and down. Encouraged her to keep a log of her blood pressures and HR to send in with her symptoms. Pt stated that she would and she would go to the ER if her symptoms worsened.

## 2022-02-20 ENCOUNTER — Telehealth: Payer: Self-pay | Admitting: Cardiology

## 2022-02-20 MED ORDER — LOSARTAN POTASSIUM 100 MG PO TABS: 50.0000 mg | ORAL_TABLET | Freq: Every day | ORAL | 3 refills | Status: DC

## 2022-02-20 NOTE — Telephone Encounter (Signed)
Returned call to pt. Advised to cut Losartan in half per Dr. Agustin Cree. Pt agreed and verbalized understanding. She will continue to keep BP log and report any changes.

## 2022-02-20 NOTE — Telephone Encounter (Signed)
Pt c/o BP issue: STAT if pt c/o blurred vision, one-sided weakness or slurred speech  1. What are your last 5 BP readings? Yesterday 77/56 HR 125, this morning 119/73 HR 101, just now 105/67 HR 102  2. Are you having any other symptoms (ex. Dizziness, headache, blurred vision, passed out)? Lightheaded and headache for 3 days  3. What is your BP issue? Patient states her BP has been continuously going down. She says she had to [place] yesterday due to feeling so lightheaded. She says when she got home her BP was 77/56 and her HR was 125. She says she cried yesterday from feeling so bad and thinks she would have passed out had she not gotten home. She says she has been resting and drinking fluids. She says her BP have been ranging 123/77/60's. She says she cannot keep feeling like this everyday.

## 2022-02-27 ENCOUNTER — Telehealth: Payer: Self-pay | Admitting: Cardiology

## 2022-02-27 ENCOUNTER — Telehealth: Payer: Self-pay

## 2022-02-27 DIAGNOSIS — I2510 Atherosclerotic heart disease of native coronary artery without angina pectoris: Secondary | ICD-10-CM

## 2022-02-27 DIAGNOSIS — E782 Mixed hyperlipidemia: Secondary | ICD-10-CM

## 2022-02-27 NOTE — Telephone Encounter (Signed)
Patient is returning call to discuss lab results. 

## 2022-02-27 NOTE — Telephone Encounter (Signed)
Results reviewed with pt as per Dr. Krasowski's note.  Pt verbalized understanding and had no additional questions. Routed to PCP  

## 2022-02-27 NOTE — Telephone Encounter (Signed)
-----   Message from Park Liter, MD sent at 02/17/2022  9:02 AM EDT ----- Cholesterol still not what it supposed to be.  Please refer her to our lipid clinic for consideration of PCSK9 agents

## 2022-03-20 ENCOUNTER — Other Ambulatory Visit: Payer: Self-pay | Admitting: Cardiology

## 2022-03-20 ENCOUNTER — Ambulatory Visit: Payer: Medicare Other | Admitting: Cardiology

## 2022-03-28 ENCOUNTER — Ambulatory Visit: Payer: Medicare HMO | Attending: Interventional Cardiology | Admitting: Pharmacist

## 2022-03-28 DIAGNOSIS — I2510 Atherosclerotic heart disease of native coronary artery without angina pectoris: Secondary | ICD-10-CM | POA: Diagnosis not present

## 2022-03-28 DIAGNOSIS — I739 Peripheral vascular disease, unspecified: Secondary | ICD-10-CM

## 2022-03-28 DIAGNOSIS — E785 Hyperlipidemia, unspecified: Secondary | ICD-10-CM | POA: Diagnosis not present

## 2022-03-28 NOTE — Patient Instructions (Signed)
I will reach out to the research team to see if you qualify for either study. They will reach out to you.

## 2022-03-28 NOTE — Progress Notes (Unsigned)
Patient ID: Catherine Crawford                 DOB: [birth year]                    MRN: [id number]      HPI: Catherine Crawford is a 56 y.o. female patient referred to lipid clinic by Dr. Agustin Cree. PMH is significant for coronary artery disease, status post myocardial infarction 2018 at that time she was also identified to have completely occluded distal aorta with some claudications. Additional problems include dyslipidemia, she is an ex-smoker quit smoking 5 years ago, essential hypertension and PAD.  Patient presents today to lipid clinic. She is doing well with rosuvastatin and ezetimibe. She has a very strong family hx of premature CAD. Lpa has not been checked.  Current Medications: rosuvastatin '40mg'$  daily, ezetimibe '10mg'$  daily Intolerances:  Risk Factors: premature CAD LDL goal: <55 Apo B goal: <70  Family History: The patient's family history includes AAA (abdominal aortic aneurysm) in her mother; Bone cancer in her maternal aunt; Brain cancer in her brother; Breast cancer in her maternal aunt; CAD in her brother, father, and mother; Cancer in her maternal aunt; Cervical cancer in her maternal aunt; Heart attack in her father and mother; Heart attack (age of onset: 21) in her sister; Heart disease in her brother, father, mother, and sister; Liver cancer in her father; Lung cancer in her mother; Skin cancer in her maternal aunt; Uterine cancer in her maternal aunt.  Social History:  Social History   Socioeconomic History   Marital status: Married    Spouse name: Not on file   Number of children: 2   Years of education: Not on file   Highest education level: Not on file  Occupational History   Occupation: disabled  Tobacco Use   Smoking status: Former    Packs/day: 2.00    Years: 37.00    Total pack years: 74.00    Types: Cigarettes    Quit date: 04/13/2017    Years since quitting: 4.9   Smokeless tobacco: Never  Vaping Use   Vaping Use: Never used  Substance and Sexual Activity    Alcohol use: Not Currently   Drug use: Not Currently   Sexual activity: Not on file    Comment: Hysterectomy  Other Topics Concern   Not on file  Social History Narrative   One level with husband      Right handed      Caffeine none      Exercise - not really      Education 12th grade   Social Determinants of Health   Financial Resource Strain: Not on file  Food Insecurity: Not on file  Transportation Needs: Not on file  Physical Activity: Not on file  Stress: Not on file  Social Connections: Not on file  Intimate Partner Violence: Not on file     Labs: 02/13/22 TC 170 TG 146 HDL 48 LDL-C 98 (rosuvastatin '40mg'$  daily, ezetimibe '10mg'$  daily)  Past Medical History:  Diagnosis Date   Aortic occlusion ([place]) 04/16/2018   AVD (aortic valve disease)    CAD (coronary artery disease)    Cardiomyopathy ([place])    Cervical cancer ([place])    CHF (congestive heart failure) ([place])    Claudication in peripheral vascular disease ([place]) 02/19/2018   Completely occluded aorta distal to renal arteries   Complication of anesthesia    Woke up once during surgery   COPD (chronic obstructive pulmonary  disease) ([place])    Coronary artery disease 02/19/2018   Status post PTCA and stenting of LAD with a 3/30 millimeters stent, stents to RCA 2.5/30 8 mm stent in October 2018 in the face of acute myocardial infarction   Depression    Dyslipidemia 02/19/2018   Essential hypertension 02/19/2018   GERD (gastroesophageal reflux disease)    Heart attack ([place])    Hyperlipidemia    Hypertension    Late effect of cerebrovascular accident (CVA) 02/19/2018   With right side numbness as well as peripheral vision loss   PAD (peripheral artery disease) ([place])    Stroke ([place])    Tricuspid valve disorder    Varicosities of leg     Current Outpatient Medications on File Prior to Visit  Medication Sig Dispense Refill   Ascorbic Acid (VITAMIN C) 500 MG CAPS Take 1 capsule by mouth daily.     aspirin EC 81 MG  tablet Take 81 mg by mouth daily.     carvedilol (COREG) 3.125 MG tablet Take 1 tablet (3.125 mg total) by mouth 2 (two) times daily with a meal. 180 tablet 2   cilostazol (PLETAL) 100 MG tablet Take 1 tablet (100 mg total) by mouth 2 (two) times daily. 180 tablet 6   clopidogrel (PLAVIX) 75 MG tablet Take 1 tablet (75 mg total) by mouth daily. 90 tablet 2   CRANBERRY CONCENTRATE PO Take 1 tablet by mouth daily.     ezetimibe (ZETIA) 10 MG tablet Take 1 tablet (10 mg total) by mouth daily. TAKE 1 TABLET BY MOUTH DAILY 90 tablet 3   losartan (COZAAR) 100 MG tablet Take 0.5 tablets (50 mg total) by mouth daily. 45 tablet 3   Magnesium 250 MG TABS Take 250 mg by mouth daily.      nitroGLYCERIN (NITROSTAT) 0.4 MG SL tablet Place 1 tablet (0.4 mg total) under the tongue every 5 (five) minutes as needed. (Patient taking differently: Place 0.4 mg under the tongue every 5 (five) minutes as needed for chest pain.) 30 tablet 3   pantoprazole (PROTONIX) 40 MG tablet Take 1 tablet (40 mg total) by mouth daily. 90 tablet 2   rosuvastatin (CRESTOR) 40 MG tablet TAKE 1 TABLET BY MOUTH DAILY (Patient taking differently: Take 40 mg by mouth daily.) 90 tablet 1   vitamin B-12 (CYANOCOBALAMIN) 1000 MCG tablet Take 1,000 mcg by mouth daily.     No current facility-administered medications on file prior to visit.    Allergies  Allergen Reactions   Bee Venom Anaphylaxis   Morphine And Related Nausea And Vomiting   Other Nausea And Vomiting    Assessment/Plan:  1. Hyperlipidemia - LDL-C is above goal of <55. We discussed PCSK9i, unfortunately patient does not think she will be able to afford. Her husband works, so she does not think she would qualify for assistance. No grants currently available. I do think she might qualify for the [place] study or if her LPa is high, the [place] (a) trial. Will check LPa today. I will refer her over to the research clinic.   Thank you,   Ramond Dial, Pharm.D, BCPS,  CPP [facility] of [facility] [address] [place], [place] [address]  Phone: [phone]; Fax: [phone]

## 2022-03-29 LAB — LIPOPROTEIN A (LPA): Lipoprotein (a): 254.1 nmol/L — ABNORMAL HIGH (ref <75.0)

## 2022-03-30 ENCOUNTER — Telehealth: Payer: Self-pay | Admitting: Pharmacist

## 2022-03-30 DIAGNOSIS — Z006 Encounter for examination for normal comparison and control in clinical research program: Secondary | ICD-10-CM

## 2022-03-30 NOTE — Telephone Encounter (Signed)
  Spoke with patient who she said it was 21 years ago, she had a total hysterectomy and never had any chemo.  Her phone sends unrecognized # to VM, but she said that if you leave her a VM she will call you back.   Patient made aware of results and that she has been referred to research center

## 2022-03-30 NOTE — Research (Signed)
Called patient about the [place](a) trial. Message left on her voice mail and also sent and email with [facility] and patient brochures

## 2022-03-30 NOTE — Telephone Encounter (Signed)
Called pt and LVM for her to call back. Need more information about her cervical cancer hx

## 2022-03-30 NOTE — Telephone Encounter (Signed)
-----   Message from Chanda Busing, RN sent at 03/30/2022  7:57 AM EDT ----- Regarding: Basilio Cairo) Hi Khaidyn Staebell,    Yes she does qualify but do you know how long ago she had cervical cancer? This could disqualify her. Ivin Booty  ----- Message ----- From: Ramond Dial, RPH-CPP Sent: 03/29/2022   4:38 PM EDT To: Chanda Busing, RN  Carlyon Shadow,  I think this patient would qualify for either the [place] (a) trial or the [place] trial. I have discussed with patient who is interested.  Im sorry if you are not the RN for these trial. I thought I had it written down, but I couldn't find it.

## 2022-05-16 ENCOUNTER — Encounter: Payer: Self-pay | Admitting: *Deleted

## 2022-05-16 NOTE — Research (Unsigned)
Called Ms. Settles to follow-up on [place](a) trial screening. LMTCB

## 2022-08-29 ENCOUNTER — Other Ambulatory Visit: Payer: Self-pay | Admitting: Cardiology

## 2022-09-11 ENCOUNTER — Ambulatory Visit: Payer: Medicare HMO | Attending: Cardiology | Admitting: Cardiology

## 2022-09-11 ENCOUNTER — Encounter: Payer: Self-pay | Admitting: Cardiology

## 2022-09-11 VITALS — BP 120/72 | HR 90 | Ht 64.0 in | Wt 235.0 lb

## 2022-09-11 DIAGNOSIS — E785 Hyperlipidemia, unspecified: Secondary | ICD-10-CM

## 2022-09-11 DIAGNOSIS — I2510 Atherosclerotic heart disease of native coronary artery without angina pectoris: Secondary | ICD-10-CM

## 2022-09-11 DIAGNOSIS — I10 Essential (primary) hypertension: Secondary | ICD-10-CM | POA: Diagnosis not present

## 2022-09-11 DIAGNOSIS — I6930 Unspecified sequelae of cerebral infarction: Secondary | ICD-10-CM

## 2022-09-11 DIAGNOSIS — I739 Peripheral vascular disease, unspecified: Secondary | ICD-10-CM | POA: Diagnosis not present

## 2022-09-11 DIAGNOSIS — I700 Atherosclerosis of aorta: Secondary | ICD-10-CM

## 2022-09-11 DIAGNOSIS — I255 Ischemic cardiomyopathy: Secondary | ICD-10-CM | POA: Diagnosis not present

## 2022-09-11 DIAGNOSIS — R079 Chest pain, unspecified: Secondary | ICD-10-CM

## 2022-09-11 NOTE — Progress Notes (Unsigned)
Cardiology Office Note:    Date:  09/11/2022   ID:  Catherine Crawford, DOB [birth year], MRN [id number]  PCP:  Patient, [name]  Cardiologist:  Jenne Campus, MD    Referring MD: No ref. provider found   Chief Complaint  Patient presents with   Follow-up  Not doing well  History of Present Illness:    Catherine Crawford is a 57 y.o. female with past medical history significant for coronary artery disease, status post myocardial infarction 2018 at that time she was also identified to have completely occluded distal aorta with some claudications. Additional problems include dyslipidemia, she is an ex-smoker quit smoking 5 years ago, essential hypertension.    She comes today to my office for follow-up she does have numerous complaints.  First of all she thinks she has a stroke she complained of I am some facial numbness and some shooting like pain on the left side of her face.  It is very sensitive to touch especially around left nostril, no pain in the temporal area, she also complained of having pain in the right side of her chest and she tells me this is similar thing that happened years ago in 2019 she required stenting.  She is able to go to store and do shopping her shoulder she uses a cart when she does it.  Past Medical History:  Diagnosis Date   Aortic occlusion ([place]) 04/16/2018   AVD (aortic valve disease)    CAD (coronary artery disease)    Cardiomyopathy (HCC)    Cervical cancer (HCC)    CHF (congestive heart failure) (HCC)    Claudication in peripheral vascular disease ([place]) 02/19/2018   Completely occluded aorta distal to renal arteries   Complication of anesthesia    Woke up once during surgery   COPD (chronic obstructive pulmonary disease) ([place])    Coronary artery disease 02/19/2018   Status post PTCA and stenting of LAD with a 3/30 millimeters stent, stents to RCA 2.5/30 8 mm stent in October 2018 in the face of acute myocardial infarction   Depression    Dyslipidemia  02/19/2018   Essential hypertension 02/19/2018   GERD (gastroesophageal reflux disease)    Heart attack ([place])    Hyperlipidemia    Hypertension    Late effect of cerebrovascular accident (CVA) 02/19/2018   With right side numbness as well as peripheral vision loss   PAD (peripheral artery disease) ([place])    Stroke ([place])    Tricuspid valve disorder    Varicosities of leg     Past Surgical History:  Procedure Laterality Date   ABDOMINAL HYSTERECTOMY  2002   CARDIAC CATHETERIZATION     CESAREAN SECTION     CHOLECYSTECTOMY N/A 05/27/2020   Procedure: LAPAROSCOPIC CHOLECYSTECTOMY;  Surgeon: Greer Pickerel, MD;  Location: [facility];  Service: General;  Laterality: N/A;   CORONARY ANGIOPLASTY WITH STENT PLACEMENT     HERNIA REPAIR  2004   hiatal hernia, double lower abdominal hernia   MULTIPLE TOOTH EXTRACTIONS     PERIPHERAL ARTERIAL STENT GRAFT     x 2    Current Medications: Current Meds  Medication Sig   Ascorbic Acid (VITAMIN C) 500 MG CAPS Take 3 capsules by mouth daily.   aspirin EC 81 MG tablet Take 81 mg by mouth daily.   carvedilol (COREG) 3.125 MG tablet Take 1 tablet (3.125 mg total) by mouth 2 (two) times daily with a meal.   cilostazol (PLETAL) 100 MG tablet Take 1 tablet (100  mg total) by mouth 2 (two) times daily.   clopidogrel (PLAVIX) 75 MG tablet Take 1 tablet (75 mg total) by mouth daily.   Cranberry 400 MG CAPS Take 400 mg by mouth daily.   cyanocobalamin (VITAMIN B12) 500 MCG tablet Take 500 mcg by mouth daily.   ezetimibe (ZETIA) 10 MG tablet Take 1 tablet (10 mg total) by mouth daily. TAKE 1 TABLET BY MOUTH DAILY   losartan (COZAAR) 100 MG tablet Take 0.5 tablets (50 mg total) by mouth daily.   Magnesium 500 MG CAPS Take 500 mg by mouth daily.   Melatonin 10 MG TABS Take 10 mg by mouth at bedtime.   nitroGLYCERIN (NITROSTAT) 0.4 MG SL tablet Place 1 tablet (0.4 mg total) under the tongue every 5 (five) minutes as needed.   pantoprazole (PROTONIX) 40 MG tablet Take  1 tablet (40 mg total) by mouth daily.   rosuvastatin (CRESTOR) 40 MG tablet TAKE 1 TABLET BY MOUTH DAILY     Allergies:   Bee venom, Morphine and related, and Other   Social History   Socioeconomic History   Marital status: Married    Spouse name: Not on file   Number of children: 2   Years of education: Not on file   Highest education level: Not on file  Occupational History   Occupation: disabled  Tobacco Use   Smoking status: Former    Packs/day: 2.00    Years: 37.00    Total pack years: 74.00    Types: Cigarettes    Quit date: 04/13/2017    Years since quitting: 5.4   Smokeless tobacco: Never  Vaping Use   Vaping Use: Never used  Substance and Sexual Activity   Alcohol use: Not Currently   Drug use: Not Currently   Sexual activity: Not on file    Comment: Hysterectomy  Other Topics Concern   Not on file  Social History Narrative   One level with husband      Right handed      Caffeine none      Exercise - not really      Education 12th grade   Social Determinants of Health   Financial Resource Strain: Not on file  Food Insecurity: Not on file  Transportation Needs: Not on file  Physical Activity: Not on file  Stress: Not on file  Social Connections: Not on file     Family History: The patient's family history includes AAA (abdominal aortic aneurysm) in her mother; Bone cancer in her maternal aunt; Brain cancer in her brother; Breast cancer in her maternal aunt; CAD in her brother, father, and mother; Cancer in her maternal aunt; Cervical cancer in her maternal aunt; Heart attack in her father and mother; Heart attack (age of onset: 9) in her sister; Heart disease in her brother, father, mother, and sister; Liver cancer in her father; Lung cancer in her mother; Skin cancer in her maternal aunt; Uterine cancer in her maternal aunt. ROS:   Please see the history of present illness.    All 14 point review of systems negative except as described per history  of present illness  EKGs/Labs/Other Studies Reviewed:      Recent Labs: 01/16/2022: BUN 13; Creatinine, Ser 1.15; Potassium 4.2; Sodium 141 02/13/2022: ALT 16  Recent Lipid Panel    Component Value Date/Time   CHOL 170 02/13/2022 1111   TRIG 146 02/13/2022 1111   HDL 48 02/13/2022 1111   CHOLHDL 3.5 02/13/2022 1111   LDLCALC 96  02/13/2022 1111    Physical Exam:    VS:  BP 120/72 (BP Location: Left Arm, Patient Position: Sitting, Cuff Size: Large)   Pulse 90   Ht '5\' 4"'$  (1.626 m)   Wt 235 lb (106.6 kg)   SpO2 94%   BMI 40.34 kg/m     Wt Readings from Last 3 Encounters:  09/11/22 235 lb (106.6 kg)  02/09/22 234 lb 6.4 oz (106.3 kg)  09/08/21 229 lb (103.9 kg)     GEN:  Well nourished, well developed in no acute distress HEENT: Normal NECK: No JVD; No carotid bruits LYMPHATICS: No lymphadenopathy CARDIAC: RRR, no murmurs, no rubs, no gallops RESPIRATORY:  Clear to auscultation without rales, wheezing or rhonchi  ABDOMEN: Soft, non-tender, non-distended MUSCULOSKELETAL:  No edema; No deformity  SKIN: Warm and dry LOWER EXTREMITIES: no swelling NEUROLOGIC:  Alert and oriented x 3 PSYCHIATRIC:  Normal affect   ASSESSMENT:    1. Arteriosclerosis of coronary artery   2. Ischemic cardiomyopathy   3. PAD (peripheral artery disease) ([place])   4. Essential hypertension   5. Aortic occlusion (HCC)   6. Late effect of cerebrovascular accident (CVA)   7. Dyslipidemia    PLAN:    In order of problems listed above:  Coronary artery disease.  Atypical presentation, atypical symptoms, dual antiplatelet therapy which I will continue.  I will ask her to have a stress test make sure were not dealing with any inducible ischemia. History of ischemic cardiomyopathy last echocardiogram showed relatively preserved left ventricle ejection fraction we will continue monitoring. Advanced peripheral vascular disease with completely occluded distal abdominal aorta.  Claudications but  stable. Essential hypertension blood pressure well-controlled continue present management. Dyslipidemia I did review K PN which show me LDL 96 HDL 48 this is from February 13, 2022.  Will schedule her to have fasting lipid profile done   Medication Adjustments/Labs and Tests Ordered: Current medicines are reviewed at length with the patient today.  Concerns regarding medicines are outlined above.  No orders of the defined types were placed in this encounter.  Medication changes: No orders of the defined types were placed in this encounter.   Signed, Park Liter, MD, [facility] 09/11/2022 11:49 AM    [place]

## 2022-09-11 NOTE — Patient Instructions (Addendum)
Medication Instructions:  Your physician recommends that you continue on your current medications as directed. Please refer to the Current Medication list given to you today.  *If you need a refill on your cardiac medications before your next appointment, please call your pharmacy*   Lab Work: Your physician recommends that you return for lab work in: when fasting You need to have labs done when you are fasting.  You can come Monday through Friday 8:30 am to 12:00 pm and 1:15 to 4:30. You do not need to make an appointment as the order has already been placed. The labs you are going to have done are AST, ALT Lipids.    Testing/Procedures: Your physician has requested that you have a lexiscan myoview. For further information please visit [email or web address]. Please follow instruction sheet, as given.  The test will take approximately 3 to 4 hours to complete; you may bring reading material.  If someone comes with you to your appointment, they will need to remain in the main lobby due to limited space in the testing area.   How to prepare for your Myocardial Perfusion Test: Do not eat or drink 3 hours prior to your test, except you may have water. Do not consume products containing caffeine (regular or decaffeinated) 12 hours prior to your test. (ex: coffee, chocolate, sodas, tea). Do bring a list of your current medications with you.  If not listed below, you may take your medications as normal. Do wear comfortable clothes (no dresses or overalls) and walking shoes, tennis shoes preferred (No heels or open toe shoes are allowed). Do NOT wear cologne, perfume, aftershave, or lotions (deodorant is allowed). If these instructions are not followed, your test will have to be rescheduled.   CT Of Head- Non-Cardiac CT scanning, (CAT scanning), is a noninvasive, special x-ray that produces cross-sectional images of the body using x-rays and a computer. CT scans help physicians diagnose and treat  medical conditions. For some CT exams, a contrast material is used to enhance visibility in the area of the body being studied. CT scans provide greater clarity and reveal more details than regular x-ray exams.     Follow-Up: At [facility], you and your health needs are our priority.  As part of our continuing mission to provide you with exceptional heart care, we have created designated Provider Care Teams.  These Care Teams include your primary Cardiologist (physician) and Advanced Practice Providers (APPs -  Physician Assistants and Nurse Practitioners) who all work together to provide you with the care you need, when you need it.  We recommend signing up for the patient portal called "MyChart".  Sign up information is provided on this After Visit Summary.  MyChart is used to connect with patients for Virtual Visits (Telemedicine).  Patients are able to view lab/test results, encounter notes, upcoming appointments, etc.  Non-urgent messages can be sent to your provider as well.   To learn more about what you can do with MyChart, go to [email or web address].    Your next appointment:   3 month(s)  The format for your next appointment:   In Person  Provider:   Jenne Campus, MD    Other Instructions NA

## 2022-09-12 ENCOUNTER — Telehealth (HOSPITAL_COMMUNITY): Payer: Self-pay | Admitting: *Deleted

## 2022-09-12 NOTE — Telephone Encounter (Signed)
Left message on voicemail per DPR in reference to upcoming appointment scheduled on 09/13/22 with detailed instructions given per Myocardial Perfusion Study Information Sheet for the test. LM to arrive 15 minutes early, and that it is imperative to arrive on time for appointment to keep from having the test rescheduled. If you need to cancel or reschedule your appointment, please call the office within 24 hours of your appointment. Failure to do so may result in a cancellation of your appointment, and a $50 no show fee. Phone number given for call back for any questions. Kirstie Peri

## 2022-09-13 ENCOUNTER — Ambulatory Visit: Payer: Medicare HMO

## 2022-09-14 ENCOUNTER — Ambulatory Visit: Payer: Medicare HMO

## 2022-09-15 ENCOUNTER — Telehealth: Payer: Self-pay | Admitting: Cardiology

## 2022-09-15 NOTE — Telephone Encounter (Signed)
Patient states she is being put on a steroid, she wants to make sure that is not going to interfere with the stress test she has scheduled for next week. She states she is suppose to start taking them this afternoon. Please advise.

## 2022-09-15 NOTE — Telephone Encounter (Signed)
Per Dr. Agustin Cree the steroid should not interfere with the stress test next week

## 2022-09-19 ENCOUNTER — Telehealth: Payer: Self-pay | Admitting: Cardiology

## 2022-09-19 NOTE — Telephone Encounter (Signed)
New Message:    Patient said she would like to have her CT at [facility]. She said to have this, she must have an order faxed to the hospital at  [phone].

## 2022-09-19 NOTE — Telephone Encounter (Signed)
Spoke with Ammie Dalton L. At front desk. She has order for CT head and the auth. She will be scheduling CT for pt and giving her the info when she comes in tomorrow for her stress test. Notified pt.

## 2022-09-20 ENCOUNTER — Ambulatory Visit: Payer: Medicare HMO | Attending: Cardiology

## 2022-09-20 DIAGNOSIS — R079 Chest pain, unspecified: Secondary | ICD-10-CM | POA: Diagnosis not present

## 2022-09-20 MED ORDER — REGADENOSON 0.4 MG/5ML IV SOLN
0.4000 mg | Freq: Once | INTRAVENOUS | Status: AC
  Administered 2022-09-20: 0.4 mg via INTRAVENOUS

## 2022-09-20 MED ORDER — TECHNETIUM TC 99M TETROFOSMIN IV KIT
32.3000 | PACK | Freq: Once | INTRAVENOUS | Status: AC | PRN
  Administered 2022-09-20: 32.3 via INTRAVENOUS

## 2022-09-21 ENCOUNTER — Ambulatory Visit: Payer: Medicare HMO | Attending: Cardiology

## 2022-09-21 LAB — LIPID PANEL
Chol/HDL Ratio: 1.9 ratio (ref 0.0–4.4)
Cholesterol, Total: 155 mg/dL (ref 100–199)
HDL: 80 mg/dL (ref >39)
LDL Chol Calc (NIH): 60 mg/dL (ref 0–99)
Triglycerides: 79 mg/dL (ref 0–149)
VLDL Cholesterol Cal: 15 mg/dL (ref 5–40)

## 2022-09-21 LAB — AST: AST: 16 IU/L (ref 0–40)

## 2022-09-21 LAB — ALT: ALT: 14 IU/L (ref 0–32)

## 2022-09-21 MED ORDER — TECHNETIUM TC 99M TETROFOSMIN IV KIT
32.7000 | PACK | Freq: Once | INTRAVENOUS | Status: AC | PRN
  Administered 2022-09-21: 32.7 via INTRAVENOUS

## 2022-09-22 ENCOUNTER — Telehealth: Payer: Self-pay

## 2022-09-22 LAB — MYOCARDIAL PERFUSION IMAGING
LV dias vol: 75 mL (ref 46–106)
LV sys vol: 30 mL
Nuc Stress EF: 60 %
Peak HR: 111 {beats}/min
Rest HR: 61 {beats}/min
Rest Nuclear Isotope Dose: 32.7 mCi
SDS: 5
SRS: 1
SSS: 6
ST Depression (mm): 0 mm
Stress Nuclear Isotope Dose: 32.3 mCi
TID: 0.99

## 2022-09-22 NOTE — Telephone Encounter (Signed)
PA approved on CMM for Cilostazol 100 mg until 07/10/23.

## 2022-09-22 NOTE — Telephone Encounter (Signed)
PA submitted on CMM for Cilostazol 100 mg. Key [id number]

## 2022-09-26 ENCOUNTER — Telehealth: Payer: Self-pay

## 2022-09-26 ENCOUNTER — Telehealth: Payer: Self-pay | Admitting: Cardiology

## 2022-09-26 NOTE — Telephone Encounter (Signed)
Patient can't access mychart, asking for a call to go over results. Please advise

## 2022-09-26 NOTE — Telephone Encounter (Signed)
Results reviewed with pt as per Dr. Krasowski's note.  Pt verbalized understanding and had no additional questions. Routed to PCP  

## 2022-09-26 NOTE — Telephone Encounter (Signed)
Patient notified of results through my chart.

## 2022-09-26 NOTE — Telephone Encounter (Signed)
-----   Message from Park Liter, MD sent at 09/21/2022  9:25 PM EDT ----- Cholesterol perfect, continue present management

## 2022-09-29 ENCOUNTER — Encounter: Payer: Self-pay | Admitting: Cardiology

## 2022-09-29 ENCOUNTER — Ambulatory Visit: Payer: Medicare HMO | Attending: Cardiology | Admitting: Cardiology

## 2022-09-29 ENCOUNTER — Ambulatory Visit (HOSPITAL_BASED_OUTPATIENT_CLINIC_OR_DEPARTMENT_OTHER): Payer: Medicare HMO

## 2022-09-29 VITALS — BP 128/70 | HR 74 | Ht 64.0 in | Wt 237.0 lb

## 2022-09-29 DIAGNOSIS — I2510 Atherosclerotic heart disease of native coronary artery without angina pectoris: Secondary | ICD-10-CM

## 2022-09-29 DIAGNOSIS — I739 Peripheral vascular disease, unspecified: Secondary | ICD-10-CM | POA: Diagnosis not present

## 2022-09-29 DIAGNOSIS — I6930 Unspecified sequelae of cerebral infarction: Secondary | ICD-10-CM

## 2022-09-29 DIAGNOSIS — I700 Atherosclerosis of aorta: Secondary | ICD-10-CM

## 2022-09-29 DIAGNOSIS — R9439 Abnormal result of other cardiovascular function study: Secondary | ICD-10-CM

## 2022-09-29 DIAGNOSIS — E785 Hyperlipidemia, unspecified: Secondary | ICD-10-CM

## 2022-09-29 HISTORY — DX: Abnormal result of other cardiovascular function study: R94.39

## 2022-09-29 NOTE — Patient Instructions (Signed)
Medication Instructions:   Hold: Losartan Day of cardiac cath     *If you need a refill on your cardiac medications before your next appointment, please call your pharmacy*   Lab Work: CBC, BMP- today  If you have labs (blood work) drawn today and your tests are completely normal, you will receive your results only by: [place] (if you have MyChart) OR A paper copy in the mail If you have any lab test that is abnormal or we need to change your treatment, we will call you to review the results.   Testing/Procedures:  [facility] DE[facility] OF [place] [id number] Dept: [phone] Loc: [place]  09/29/2022  You are scheduled for a Cardiac Catheterization on Friday, March 29 with Dr. Peter Martinique.  1. Please arrive at the [facility] (Main Entrance A) at [facility]: [address] [place], [place] [address] at 7:00 AM (This time is two hours before your procedure to ensure your preparation). Free valet parking service is available.   Special note: Every effort is made to have your procedure done on time. Please understand that emergencies sometimes delay scheduled procedures.  2. Diet: Do not eat solid foods after midnight.  The patient may have clear liquids until 5am upon the day of the procedure.  3. Labs: You will need to have blood drawn on Friday, March 22 at [facility]: [address] Technical sales engineer . You do not need to be fasting.  4. Medication instructions in preparation for your procedure:   Contrast Allergy: No    Current Outpatient Medications (Cardiovascular):    carvedilol (COREG) 3.125 MG tablet, Take 1 tablet (3.125 mg total) by mouth 2 (two) times daily with a meal.   ezetimibe (ZETIA) 10 MG tablet, Take 1 tablet (10 mg total) by mouth daily. TAKE 1 TABLET BY MOUTH DAILY   losartan (COZAAR) 100 MG tablet, Take 0.5 tablets (50 mg  total) by mouth daily.   nitroGLYCERIN (NITROSTAT) 0.4 MG SL tablet, Place 1 tablet (0.4 mg total) under the tongue every 5 (five) minutes as needed.   rosuvastatin (CRESTOR) 40 MG tablet, TAKE 1 TABLET BY MOUTH DAILY   Current Outpatient Medications (Analgesics):    aspirin EC 81 MG tablet, Take 81 mg by mouth daily.  Current Outpatient Medications (Hematological):    cilostazol (PLETAL) 100 MG tablet, Take 1 tablet (100 mg total) by mouth 2 (two) times daily.   clopidogrel (PLAVIX) 75 MG tablet, Take 1 tablet (75 mg total) by mouth daily.   cyanocobalamin (VITAMIN B12) 500 MCG tablet, Take 500 mcg by mouth daily.  Current Outpatient Medications (Other):    Ascorbic Acid (VITAMIN C) 500 MG CAPS, Take 3 capsules by mouth daily.   Cranberry 400 MG CAPS, Take 400 mg by mouth daily.   Magnesium 500 MG CAPS, Take 500 mg by mouth daily.   Melatonin 10 MG TABS, Take 10 mg by mouth at bedtime.   pantoprazole (PROTONIX) 40 MG tablet, Take 1 tablet (40 mg total) by mouth daily. *For reference purposes while preparing patient instructions.   Delete this med list prior to printing instructions for patient.*    Stop taking, Cozaar (Losartan) Friday, March 29,    On the morning of your procedure, take your Plavix/Clopidogrel and any morning medicines NOT listed above.  You may use sips of water.  5. Plan for one night stay--bring  personal belongings. 6. Bring a current list of your medications and current insurance cards. 7. You MUST have a responsible person to drive you home. 8. Someone MUST be with you the first 24 hours after you arrive home or your discharge will be delayed. 9. Please wear clothes that are easy to get on and off and wear slip-on shoes.  Thank you for allowing [place] to care for you!   -- [facility]     Follow-Up: At [facility], you and your health needs are our priority.  As part of our continuing mission to provide you with  exceptional heart care, we have created designated Provider Care Teams.  These Care Teams include your primary Cardiologist (physician) and Advanced Practice Providers (APPs -  Physician Assistants and Nurse Practitioners) who all work together to provide you with the care you need, when you need it.  We recommend signing up for the patient portal called "MyChart".  Sign up information is provided on this After Visit Summary.  MyChart is used to connect with patients for Virtual Visits (Telemedicine).  Patients are able to view lab/test results, encounter notes, upcoming appointments, etc.  Non-urgent messages can be sent to your provider as well.   To learn more about what you can do with MyChart, go to [email or web address].    Your next appointment:   2 month(s)  The format for your next appointment:   In Person  Provider:   Jenne Campus, MD   Other Instructions  Coronary Angiogram With Stent Coronary angiogram with stent placement is a procedure to widen or open a narrow blood vessel of the heart (coronary artery). Arteries may become blocked by cholesterol buildup (plaques) in the lining of the artery wall. When a coronary artery becomes partially blocked, blood flow to that area decreases. This may lead to chest pain or a heart attack (myocardial infarction). A stent is a small piece of metal that looks like mesh or spring. Stent placement may be done as treatment after a heart attack, or to prevent a heart attack if a blocked artery is found by a coronary angiogram. Let your health care provider know about: Any allergies you have, including allergies to medicines or contrast dye. All medicines you are taking, including vitamins, herbs, eye drops, creams, and over-the-counter medicines. Any problems you or family members have had with anesthetic medicines. Any blood disorders you have. Any surgeries you have had. Any medical conditions you have, including kidney problems or  kidney failure. Whether you are pregnant or may be pregnant. Whether you are breastfeeding. What are the risks? Generally, this is a safe procedure. However, serious problems may occur, including: Damage to nearby structures or organs, such as the heart, blood vessels, or kidneys. A return of blockage. Bleeding, infection, or bruising at the insertion site. A collection of blood under the skin (hematoma) at the insertion site. A blood clot in another part of the body. Allergic reaction to medicines or dyes. Bleeding into the abdomen (retroperitoneal bleeding). Stroke (rare). Heart attack (rare). What happens before the procedure? Staying hydrated Follow instructions from your health care provider about hydration, which may include: Up to 2 hours before the procedure - you may continue to drink clear liquids, such as water, clear fruit juice, black coffee, and plain tea.    Eating and drinking restrictions Follow instructions from your health care provider about eating and drinking, which may include: 8 hours before the procedure - stop eating heavy meals or  foods, such as meat, fried foods, or fatty foods. 6 hours before the procedure - stop eating light meals or foods, such as toast or cereal. 2 hours before the procedure - stop drinking clear liquids. Medicines Ask your health care provider about: Changing or stopping your regular medicines. This is especially important if you are taking diabetes medicines or blood thinners. Taking medicines such as aspirin and ibuprofen. These medicines can thin your blood. Do not take these medicines unless your health care provider tells you to take them. Generally, aspirin is recommended before a thin tube, called a catheter, is passed through a blood vessel and inserted into the heart (cardiac catheterization). Taking over-the-counter medicines, vitamins, herbs, and supplements. General instructions Do not use any products that contain nicotine  or tobacco for at least 4 weeks before the procedure. These products include cigarettes, e-cigarettes, and chewing tobacco. If you need help quitting, ask your health care provider. Plan to have someone take you home from the hospital or clinic. If you will be going home right after the procedure, plan to have someone with you for 24 hours. You may have tests and imaging procedures. Ask your health care provider: How your insertion site will be marked. Ask which artery will be used for the procedure. What steps will be taken to help prevent infection. These may include: Removing hair at the insertion site. Washing skin with a germ-killing soap. Taking antibiotic medicine. What happens during the procedure? An IV will be inserted into one of your veins. Electrodes may be placed on your chest to monitor your heart rate during the procedure. You will be given one or more of the following: A medicine to help you relax (sedative). A medicine to numb the area (local anesthetic) for catheter insertion. A small incision will be made for catheter insertion. The catheter will be inserted into an artery using a guide wire. The location may be in your groin, your wrist, or the fold of your arm (near your elbow). An X-ray procedure (fluoroscopy) will be used to help guide the catheter to the opening of the heart arteries. A dye will be injected into the catheter. X-rays will be taken. The dye helps to show where any narrowing or blockages are located in the arteries. Tell your health care provider if you have chest pain or trouble breathing. A tiny wire will be guided to the blocked spot, and a balloon will be inflated to make the artery wider. The stent will be expanded to crush the plaques into the wall of the vessel. The stent will hold the area open and improve the blood flow. Most stents have a drug coating to reduce the risk of the stent narrowing over time. The artery may be made wider using a  drill, laser, or other tools that remove plaques. The catheter will be removed when the blood flow improves. The stent will stay where it was placed, and the lining of the artery will grow over it. A bandage (dressing) will be placed on the insertion site. Pressure will be applied to stop bleeding. The IV will be removed. This procedure may vary among health care providers and hospitals.    What happens after the procedure? Your blood pressure, heart rate, breathing rate, and blood oxygen level will be monitored until you leave the hospital or clinic. If the procedure is done through the leg, you will lie flat in bed for a few hours or for as long as told by your health care  provider. You will be instructed not to bend or cross your legs. The insertion site and the pulse in your foot or wrist will be checked often. You may have more blood tests, X-rays, and a test that records the electrical activity of your heart (electrocardiogram, or ECG). Do not drive for 24 hours if you were given a sedative during your procedure. Summary Coronary angiogram with stent placement is a procedure to widen or open a narrowed coronary artery. This is done to treat heart problems. Before the procedure, let your health care provider know about all the medical conditions and surgeries you have or have had. This is a safe procedure. However, some problems may occur, including damage to nearby structures or organs, bleeding, blood clots, or allergies. Follow your health care provider's instructions about eating, drinking, medicines, and other lifestyle changes, such as quitting tobacco use before the procedure. This information is not intended to replace advice given to you by your health care provider. Make sure you discuss any questions you have with your health care provider. Document Revised: 01/15/2019 Document Reviewed: 01/15/2019 Elsevier Patient Education  [place].

## 2022-09-29 NOTE — Progress Notes (Unsigned)
Cardiology Office Note:    Date:  09/29/2022   ID:  Catherine Crawford, DOB 07-09-1966, MRN [id number]  PCP:  Patient, [name]  Cardiologist:  Jenne Campus, MD    Referring MD: Park Liter, MD   Chief Complaint  Patient presents with   Follow-up  Abnormal stress test  History of Present Illness:    Catherine Crawford is a 57 y.o. female past medical history significant for coronary artery disease, in October 2018 she did have myocardial infarction as well as acute CVA, cardiac catheterization done at that time showed significant stenosis of LAD and she received stent to the vessel as well as RCA.  Her ejection fraction at that time was 20 to 25%.  He was also find to have completely occluded distal aorta distally from renal arteries.  She did have discussion at that time with vascular surgeons about potentially grafting her aorta however none of this has been done.  Been followed here for last few years recently she presented to me with very atypical symptoms she did have some facial numbness, complaining of some pain in the chest.  Interestingly she told me that was very similar symptoms that she had in 2018 when she had CVA and myocardial infarction.  She did have a stress test done stress to show ischemia in LAD territory, I did CT of her head which showed some old infarct in the left parieto-occipital cortex otherwise there were no new findings.  She is coming to my office today to discuss finding of her stress testing and talk about potentially cardiac catheterization versus medical therapy. Still complaining having the same symptoms some include some chest tightness.  Still having some headaches and shooting like pain.  After mid atypical  Past Medical History:  Diagnosis Date   Aortic occlusion ([place]) 04/16/2018   AVD (aortic valve disease)    CAD (coronary artery disease)    Cardiomyopathy (HCC)    Cervical cancer (HCC)    CHF (congestive heart failure) (HCC)     Claudication in peripheral vascular disease ([place]) 02/19/2018   Completely occluded aorta distal to renal arteries   Complication of anesthesia    Woke up once during surgery   COPD (chronic obstructive pulmonary disease) ([place])    Coronary artery disease 02/19/2018   Status post PTCA and stenting of LAD with a 3/30 millimeters stent, stents to RCA 2.5/30 8 mm stent in October 2018 in the face of acute myocardial infarction   Depression    Dyslipidemia 02/19/2018   Essential hypertension 02/19/2018   GERD (gastroesophageal reflux disease)    Heart attack ([place])    Hyperlipidemia    Hypertension    Late effect of cerebrovascular accident (CVA) 02/19/2018   With right side numbness as well as peripheral vision loss   PAD (peripheral artery disease) ([place])    Stroke ([place])    Tricuspid valve disorder    Varicosities of leg     Past Surgical History:  Procedure Laterality Date   ABDOMINAL HYSTERECTOMY  2002   CARDIAC CATHETERIZATION     CESAREAN SECTION     CHOLECYSTECTOMY N/A 05/27/2020   Procedure: LAPAROSCOPIC CHOLECYSTECTOMY;  Surgeon: Greer Pickerel, MD;  Location: [facility] ORS;  Service: General;  Laterality: N/A;   CORONARY ANGIOPLASTY WITH STENT PLACEMENT     HERNIA REPAIR  2004   hiatal hernia, double lower abdominal hernia   MULTIPLE TOOTH EXTRACTIONS     PERIPHERAL ARTERIAL STENT GRAFT     x 2  Current Medications: Current Meds  Medication Sig   Ascorbic Acid (VITAMIN C) 500 MG CAPS Take 3 capsules by mouth daily.   aspirin EC 81 MG tablet Take 81 mg by mouth daily.   carvedilol (COREG) 3.125 MG tablet Take 1 tablet (3.125 mg total) by mouth 2 (two) times daily with a meal.   cilostazol (PLETAL) 100 MG tablet Take 1 tablet (100 mg total) by mouth 2 (two) times daily.   clopidogrel (PLAVIX) 75 MG tablet Take 1 tablet (75 mg total) by mouth daily.   Cranberry 400 MG CAPS Take 400 mg by mouth daily.   cyanocobalamin (VITAMIN B12) 500 MCG tablet Take 500 mcg by mouth daily.    ezetimibe (ZETIA) 10 MG tablet Take 1 tablet (10 mg total) by mouth daily. TAKE 1 TABLET BY MOUTH DAILY   losartan (COZAAR) 100 MG tablet Take 0.5 tablets (50 mg total) by mouth daily.   Magnesium 500 MG CAPS Take 500 mg by mouth daily.   Melatonin 10 MG TABS Take 10 mg by mouth at bedtime.   nitroGLYCERIN (NITROSTAT) 0.4 MG SL tablet Place 1 tablet (0.4 mg total) under the tongue every 5 (five) minutes as needed.   pantoprazole (PROTONIX) 40 MG tablet Take 1 tablet (40 mg total) by mouth daily.   rosuvastatin (CRESTOR) 40 MG tablet TAKE 1 TABLET BY MOUTH DAILY     Allergies:   Bee venom, Morphine and related, and Other   Social History   Socioeconomic History   Marital status: Married    Spouse name: Not on file   Number of children: 2   Years of education: Not on file   Highest education level: Not on file  Occupational History   Occupation: disabled  Tobacco Use   Smoking status: Former    Packs/day: 2.00    Years: 37.00    Additional pack years: 0.00    Total pack years: 74.00    Types: Cigarettes    Quit date: 04/13/2017    Years since quitting: 5.4   Smokeless tobacco: Never  Vaping Use   Vaping Use: Never used  Substance and Sexual Activity   Alcohol use: Not Currently   Drug use: Not Currently   Sexual activity: Not on file    Comment: Hysterectomy  Other Topics Concern   Not on file  Social History Narrative   One level with husband      Right handed      Caffeine none      Exercise - not really      Education 12th grade   Social Determinants of Health   Financial Resource Strain: Not on file  Food Insecurity: Not on file  Transportation Needs: Not on file  Physical Activity: Not on file  Stress: Not on file  Social Connections: Not on file     Family History: The patient's family history includes AAA (abdominal aortic aneurysm) in her mother; Bone cancer in her maternal aunt; Brain cancer in her brother; Breast cancer in her maternal aunt; CAD in  her brother, father, and mother; Cancer in her maternal aunt; Cervical cancer in her maternal aunt; Heart attack in her father and mother; Heart attack (age of onset: 20) in her sister; Heart disease in her brother, father, mother, and sister; Liver cancer in her father; Lung cancer in her mother; Skin cancer in her maternal aunt; Uterine cancer in her maternal aunt. ROS:   Please see the history of present illness.    All 14  point review of systems negative except as described per history of present illness  EKGs/Labs/Other Studies Reviewed:      Recent Labs: 01/16/2022: BUN 13; Creatinine, Ser 1.15; Potassium 4.2; Sodium 141 09/20/2022: ALT 14  Recent Lipid Panel    Component Value Date/Time   CHOL 155 09/20/2022 0915   TRIG 79 09/20/2022 0915   HDL 80 09/20/2022 0915   CHOLHDL 1.9 09/20/2022 0915   LDLCALC 60 09/20/2022 0915    Physical Exam:    VS:  BP 128/70 (BP Location: Left Arm, Patient Position: Sitting, Cuff Size: Normal)   Pulse 74   Ht 5\' 4"  (1.626 m)   Wt 237 lb (107.5 kg)   SpO2 95%   BMI 40.68 kg/m     Wt Readings from Last 3 Encounters:  09/29/22 237 lb (107.5 kg)  09/20/22 235 lb (106.6 kg)  09/11/22 235 lb (106.6 kg)     GEN:  Well nourished, well developed in no acute distress HEENT: Normal NECK: No JVD; No carotid bruits LYMPHATICS: No lymphadenopathy CARDIAC: RRR, no murmurs, no rubs, no gallops RESPIRATORY:  Clear to auscultation without rales, wheezing or rhonchi  ABDOMEN: Soft, non-tender, non-distended MUSCULOSKELETAL:  No edema; No deformity  SKIN: Warm and dry LOWER EXTREMITIES: no swelling NEUROLOGIC:  Alert and oriented x 3 PSYCHIATRIC:  Normal affect   ASSESSMENT:    1. Aortic occlusion (HCC)   2. Arteriosclerosis of coronary artery   3. Abnormal stress test   4. Claudication in peripheral vascular disease ([place])   5. Dyslipidemia   6. Late effect of cerebrovascular accident (CVA)    PLAN:    In order of problems listed  above:  Abnormal stress test showing ischemia in LAD territory.  She does have history of stenting to LAD done in 2019 with 3/30 mm stent, she is at the same session she got stent to RCA that was done in face of acute myocardial infarction.  She said symptomatology is very similar to what she had before.  She has been maintained on dual antiplatelet therapy secondary to quite advanced disease as well as peripheral vascular disease.  We discussed options of medical therapy versus cardiac catheterization she definitely prefers cardiac catheterization.  Procedure explained to her including all risk benefits as well as alternatives.  Since she does have known occlusion of the distal aorta she have to be done from a arm.  In the meantime we will continue present management. Aortic occlusion.  Noted. Claudication which is stable.  She is on appropriate medication. Late effect of CVA nothing new based on CT of her head done recently. Dyslipidemia I did review her K PN which show me her data from September 20, 2022 with LDL of 60 HDL 80 she is taking high intense statin for Crestor 40 and Zetia 10 which I will continue.   Medication Adjustments/Labs and Tests Ordered: Current medicines are reviewed at length with the patient today.  Concerns regarding medicines are outlined above.  No orders of the defined types were placed in this encounter.  Medication changes: No orders of the defined types were placed in this encounter.   Signed, Park Liter, MD, [facility] 09/29/2022 9:36 AM    [place]

## 2022-09-29 NOTE — H&P (View-Only) (Signed)
Cardiology Office Note:    Date:  09/29/2022   ID:  Catherine Crawford, DOB 08/28/1965, MRN [id number]  PCP:  Patient, [name]  Cardiologist:  Freja Faro, MD    Referring MD: Tamey Wanek J, MD   Chief Complaint  Patient presents with   Follow-up  Abnormal stress test  History of Present Illness:    Catherine Crawford is a 57 y.o. female past medical history significant for coronary artery disease, in October 2018 she did have myocardial infarction as well as acute CVA, cardiac catheterization done at that time showed significant stenosis of LAD and she received stent to the vessel as well as RCA.  Her ejection fraction at that time was 20 to 25%.  He was also find to have completely occluded distal aorta distally from renal arteries.  She did have discussion at that time with vascular surgeons about potentially grafting her aorta however none of this has been done.  Been followed here for last few years recently she presented to me with very atypical symptoms she did have some facial numbness, complaining of some pain in the chest.  Interestingly she told me that was very similar symptoms that she had in 2018 when she had CVA and myocardial infarction.  She did have a stress test done stress to show ischemia in LAD territory, I did CT of her head which showed some old infarct in the left parieto-occipital cortex otherwise there were no new findings.  She is coming to my office today to discuss finding of her stress testing and talk about potentially cardiac catheterization versus medical therapy. Still complaining having the same symptoms some include some chest tightness.  Still having some headaches and shooting like pain.  After mid atypical  Past Medical History:  Diagnosis Date   Aortic occlusion (HCC) 04/16/2018   AVD (aortic valve disease)    CAD (coronary artery disease)    Cardiomyopathy (HCC)    Cervical cancer (HCC)    CHF (congestive heart failure) (HCC)     Claudication in peripheral vascular disease (HCC) 02/19/2018   Completely occluded aorta distal to renal arteries   Complication of anesthesia    Woke up once during surgery   COPD (chronic obstructive pulmonary disease) (HCC)    Coronary artery disease 02/19/2018   Status post PTCA and stenting of LAD with a 3/30 millimeters stent, stents to RCA 2.5/30 8 mm stent in October 2018 in the face of acute myocardial infarction   Depression    Dyslipidemia 02/19/2018   Essential hypertension 02/19/2018   GERD (gastroesophageal reflux disease)    Heart attack (HCC)    Hyperlipidemia    Hypertension    Late effect of cerebrovascular accident (CVA) 02/19/2018   With right side numbness as well as peripheral vision loss   PAD (peripheral artery disease) (HCC)    Stroke (HCC)    Tricuspid valve disorder    Varicosities of leg     Past Surgical History:  Procedure Laterality Date   ABDOMINAL HYSTERECTOMY  2002   CARDIAC CATHETERIZATION     CESAREAN SECTION     CHOLECYSTECTOMY N/A 05/27/2020   Procedure: LAPAROSCOPIC CHOLECYSTECTOMY;  Surgeon: Wilson, Eric, MD;  Location: [facility];  Service: General;  Laterality: N/A;   CORONARY ANGIOPLASTY WITH STENT PLACEMENT     HERNIA REPAIR  2004   hiatal hernia, double lower abdominal hernia   MULTIPLE TOOTH EXTRACTIONS     PERIPHERAL ARTERIAL STENT GRAFT     x 2      Current Medications: Current Meds  Medication Sig   Ascorbic Acid (VITAMIN C) 500 MG CAPS Take 3 capsules by mouth daily.   aspirin EC 81 MG tablet Take 81 mg by mouth daily.   carvedilol (COREG) 3.125 MG tablet Take 1 tablet (3.125 mg total) by mouth 2 (two) times daily with a meal.   cilostazol (PLETAL) 100 MG tablet Take 1 tablet (100 mg total) by mouth 2 (two) times daily.   clopidogrel (PLAVIX) 75 MG tablet Take 1 tablet (75 mg total) by mouth daily.   Cranberry 400 MG CAPS Take 400 mg by mouth daily.   cyanocobalamin (VITAMIN B12) 500 MCG tablet Take 500 mcg by mouth daily.    ezetimibe (ZETIA) 10 MG tablet Take 1 tablet (10 mg total) by mouth daily. TAKE 1 TABLET BY MOUTH DAILY   losartan (COZAAR) 100 MG tablet Take 0.5 tablets (50 mg total) by mouth daily.   Magnesium 500 MG CAPS Take 500 mg by mouth daily.   Melatonin 10 MG TABS Take 10 mg by mouth at bedtime.   nitroGLYCERIN (NITROSTAT) 0.4 MG SL tablet Place 1 tablet (0.4 mg total) under the tongue every 5 (five) minutes as needed.   pantoprazole (PROTONIX) 40 MG tablet Take 1 tablet (40 mg total) by mouth daily.   rosuvastatin (CRESTOR) 40 MG tablet TAKE 1 TABLET BY MOUTH DAILY     Allergies:   Bee venom, Morphine and related, and Other   Social History   Socioeconomic History   Marital status: Married    Spouse name: Not on file   Number of children: 2   Years of education: Not on file   Highest education level: Not on file  Occupational History   Occupation: disabled  Tobacco Use   Smoking status: Former    Packs/day: 2.00    Years: 37.00    Additional pack years: 0.00    Total pack years: 74.00    Types: Cigarettes    Quit date: 04/13/2017    Years since quitting: 5.4   Smokeless tobacco: Never  Vaping Use   Vaping Use: Never used  Substance and Sexual Activity   Alcohol use: Not Currently   Drug use: Not Currently   Sexual activity: Not on file    Comment: Hysterectomy  Other Topics Concern   Not on file  Social History Narrative   One level with husband      Right handed      Caffeine none      Exercise - not really      Education 12th grade   Social Determinants of Health   Financial Resource Strain: Not on file  Food Insecurity: Not on file  Transportation Needs: Not on file  Physical Activity: Not on file  Stress: Not on file  Social Connections: Not on file     Family History: The patient's family history includes AAA (abdominal aortic aneurysm) in her mother; Bone cancer in her maternal aunt; Brain cancer in her brother; Breast cancer in her maternal aunt; CAD in  her brother, father, and mother; Cancer in her maternal aunt; Cervical cancer in her maternal aunt; Heart attack in her father and mother; Heart attack (age of onset: 39) in her sister; Heart disease in her brother, father, mother, and sister; Liver cancer in her father; Lung cancer in her mother; Skin cancer in her maternal aunt; Uterine cancer in her maternal aunt. ROS:   Please see the history of present illness.    All 14   point review of systems negative except as described per history of present illness  EKGs/Labs/Other Studies Reviewed:      Recent Labs: 01/16/2022: BUN 13; Creatinine, Ser 1.15; Potassium 4.2; Sodium 141 09/20/2022: ALT 14  Recent Lipid Panel    Component Value Date/Time   CHOL 155 09/20/2022 0915   TRIG 79 09/20/2022 0915   HDL 80 09/20/2022 0915   CHOLHDL 1.9 09/20/2022 0915   LDLCALC 60 09/20/2022 0915    Physical Exam:    VS:  BP 128/70 (BP Location: Left Arm, Patient Position: Sitting, Cuff Size: Normal)   Pulse 74   Ht 5' 4" (1.626 m)   Wt 237 lb (107.5 kg)   SpO2 95%   BMI 40.68 kg/m     Wt Readings from Last 3 Encounters:  09/29/22 237 lb (107.5 kg)  09/20/22 235 lb (106.6 kg)  09/11/22 235 lb (106.6 kg)     GEN:  Well nourished, well developed in no acute distress HEENT: Normal NECK: No JVD; No carotid bruits LYMPHATICS: No lymphadenopathy CARDIAC: RRR, no murmurs, no rubs, no gallops RESPIRATORY:  Clear to auscultation without rales, wheezing or rhonchi  ABDOMEN: Soft, non-tender, non-distended MUSCULOSKELETAL:  No edema; No deformity  SKIN: Warm and dry LOWER EXTREMITIES: no swelling NEUROLOGIC:  Alert and oriented x 3 PSYCHIATRIC:  Normal affect   ASSESSMENT:    1. Aortic occlusion (HCC)   2. Arteriosclerosis of coronary artery   3. Abnormal stress test   4. Claudication in peripheral vascular disease (HCC)   5. Dyslipidemia   6. Late effect of cerebrovascular accident (CVA)    PLAN:    In order of problems listed  above:  Abnormal stress test showing ischemia in LAD territory.  She does have history of stenting to LAD done in 2019 with 3/30 mm stent, she is at the same session she got stent to RCA that was done in face of acute myocardial infarction.  She said symptomatology is very similar to what she had before.  She has been maintained on dual antiplatelet therapy secondary to quite advanced disease as well as peripheral vascular disease.  We discussed options of medical therapy versus cardiac catheterization she definitely prefers cardiac catheterization.  Procedure explained to her including all risk benefits as well as alternatives.  Since she does have known occlusion of the distal aorta she have to be done from a arm.  In the meantime we will continue present management. Aortic occlusion.  Noted. Claudication which is stable.  She is on appropriate medication. Late effect of CVA nothing new based on CT of her head done recently. Dyslipidemia I did review her K PN which show me her data from September 20, 2022 with LDL of 60 HDL 80 she is taking high intense statin for Crestor 40 and Zetia 10 which I will continue.   Medication Adjustments/Labs and Tests Ordered: Current medicines are reviewed at length with the patient today.  Concerns regarding medicines are outlined above.  No orders of the defined types were placed in this encounter.  Medication changes: No orders of the defined types were placed in this encounter.   Signed, Anhthu Perdew J. Kellsie Grindle, MD, FACC 09/29/2022 9:36 AM    [facility] 

## 2022-09-30 LAB — BASIC METABOLIC PANEL
BUN/Creatinine Ratio: 14 (ref 9–23)
BUN: 14 mg/dL (ref 6–24)
CO2: 21 mmol/L (ref 20–29)
Calcium: 8.7 mg/dL (ref 8.7–10.2)
Chloride: 104 mmol/L (ref 96–106)
Creatinine, Ser: 1 mg/dL (ref 0.57–1.00)
Glucose: 92 mg/dL (ref 70–99)
Potassium: 4.3 mmol/L (ref 3.5–5.2)
Sodium: 142 mmol/L (ref 134–144)
eGFR: 65 mL/min/{1.73_m2} (ref >59)

## 2022-09-30 LAB — CBC
Hematocrit: 39.4 % (ref 34.0–46.6)
Hemoglobin: 12.7 g/dL (ref 11.1–15.9)
MCH: 28.7 pg (ref 26.6–33.0)
MCHC: 32.2 g/dL (ref 31.5–35.7)
MCV: 89 fL (ref 79–97)
Platelets: 233 10*3/uL (ref 150–450)
RBC: 4.43 x10E6/uL (ref 3.77–5.28)
RDW: 13 % (ref 11.7–15.4)
WBC: 7.2 10*3/uL (ref 3.4–10.8)

## 2022-09-30 LAB — SPECIMEN STATUS REPORT

## 2022-10-03 ENCOUNTER — Telehealth: Payer: Self-pay | Admitting: Cardiology

## 2022-10-03 NOTE — Telephone Encounter (Signed)
    Pt is calling to get CT result  

## 2022-10-03 NOTE — Telephone Encounter (Signed)
LVM for pt to call regarding Heat CT results - it showed nothing new per Dr. Agustin Cree

## 2022-10-04 ENCOUNTER — Telehealth: Payer: Self-pay

## 2022-10-04 NOTE — Telephone Encounter (Signed)
Patient notified of results through my chart.

## 2022-10-04 NOTE — Telephone Encounter (Signed)
-----   Message from Park Liter, MD sent at 10/04/2022  9:42 AM EDT ----- Labs are looking good, ready to proceed with cardiac cath

## 2022-10-05 ENCOUNTER — Telehealth: Payer: Self-pay | Admitting: *Deleted

## 2022-10-05 NOTE — Telephone Encounter (Addendum)
Cardiac Catheterization scheduled at [facility] for: Friday October 06, 2022 9 AM Arrival time [place] Entr[facility] A at: 7 AM  Nothing to eat after midnight prior to procedure, clear liquids until 5 AM day of procedure.  Medication instructions: -Usual morning medications can be taken with sips of water including aspirin 81 mg and Plavix 75 mg  Confirmed patient has responsible adult to drive home post procedure and be with patient first 24 hours after arriving home.  Plan to go home the same day, you will only stay overnight if medically necessary.  Reviewed procedure instructions with patient, discussed decision would be made after cardiac cath if she would go home the same day after observation or if she would need to stay overnight.

## 2022-10-06 ENCOUNTER — Other Ambulatory Visit: Payer: Self-pay

## 2022-10-06 ENCOUNTER — Ambulatory Visit (HOSPITAL_COMMUNITY)
Admission: RE | Admit: 2022-10-06 | Discharge: 2022-10-06 | Disposition: A | Discharge status: Home / Self Care | Payer: Medicare HMO | Attending: Cardiology | Admitting: Cardiology

## 2022-10-06 ENCOUNTER — Encounter (HOSPITAL_COMMUNITY)
Admission: RE | Disposition: A | Discharge status: Home / Self Care | Payer: Self-pay | Source: Home / Self Care | Attending: Cardiology

## 2022-10-06 DIAGNOSIS — Z87891 Personal history of nicotine dependence: Secondary | ICD-10-CM | POA: Diagnosis not present

## 2022-10-06 DIAGNOSIS — E785 Hyperlipidemia, unspecified: Secondary | ICD-10-CM | POA: Diagnosis not present

## 2022-10-06 DIAGNOSIS — I2582 Chronic total occlusion of coronary artery: Secondary | ICD-10-CM | POA: Insufficient documentation

## 2022-10-06 DIAGNOSIS — I700 Atherosclerosis of aorta: Secondary | ICD-10-CM | POA: Insufficient documentation

## 2022-10-06 DIAGNOSIS — Z8673 Personal history of transient ischemic attack (TIA), and cerebral infarction without residual deficits: Secondary | ICD-10-CM | POA: Insufficient documentation

## 2022-10-06 DIAGNOSIS — Z955 Presence of coronary angioplasty implant and graft: Secondary | ICD-10-CM | POA: Insufficient documentation

## 2022-10-06 DIAGNOSIS — I739 Peripheral vascular disease, unspecified: Secondary | ICD-10-CM | POA: Insufficient documentation

## 2022-10-06 DIAGNOSIS — R9439 Abnormal result of other cardiovascular function study: Secondary | ICD-10-CM | POA: Diagnosis present

## 2022-10-06 DIAGNOSIS — I252 Old myocardial infarction: Secondary | ICD-10-CM | POA: Insufficient documentation

## 2022-10-06 DIAGNOSIS — I25118 Atherosclerotic heart disease of native coronary artery with other forms of angina pectoris: Secondary | ICD-10-CM | POA: Insufficient documentation

## 2022-10-06 DIAGNOSIS — Z79899 Other long term (current) drug therapy: Secondary | ICD-10-CM | POA: Diagnosis not present

## 2022-10-06 HISTORY — PX: LEFT HEART CATH AND CORONARY ANGIOGRAPHY: CATH118249

## 2022-10-06 SURGERY — LEFT HEART CATH AND CORONARY ANGIOGRAPHY
Anesthesia: LOCAL

## 2022-10-06 MED ORDER — HEPARIN SODIUM (PORCINE) 1000 UNIT/ML IJ SOLN
INTRAMUSCULAR | Status: DC | PRN
  Administered 2022-10-06: 5000 [IU] via INTRAVENOUS

## 2022-10-06 MED ORDER — ACETAMINOPHEN 325 MG PO TABS: 650.0000 mg | ORAL_TABLET | ORAL | Status: DC | PRN

## 2022-10-06 MED ORDER — SODIUM CHLORIDE 0.9% FLUSH: 3.0000 mL | INTRAVENOUS | Status: DC | PRN

## 2022-10-06 MED ORDER — MIDAZOLAM HCL 2 MG/2ML IJ SOLN
INTRAMUSCULAR | Status: DC | PRN
  Administered 2022-10-06: 1 mg via INTRAVENOUS

## 2022-10-06 MED ORDER — VERAPAMIL HCL 2.5 MG/ML IV SOLN
INTRAVENOUS | Status: AC
  Filled 2022-10-06: qty 2

## 2022-10-06 MED ORDER — SODIUM CHLORIDE 0.9% FLUSH: 3.0000 mL | Freq: Two times a day (BID) | INTRAVENOUS | Status: DC

## 2022-10-06 MED ORDER — CLOPIDOGREL BISULFATE 75 MG PO TABS: 75.0000 mg | ORAL_TABLET | ORAL | Status: DC

## 2022-10-06 MED ORDER — LIDOCAINE HCL (PF) 1 % IJ SOLN
INTRAMUSCULAR | Status: DC | PRN
  Administered 2022-10-06: 2 mL

## 2022-10-06 MED ORDER — SODIUM CHLORIDE 0.9 % WEIGHT BASED INFUSION: 1.0000 mL/kg/h | INTRAVENOUS | Status: DC

## 2022-10-06 MED ORDER — SODIUM CHLORIDE 0.9 % WEIGHT BASED INFUSION
3.0000 mL/kg/h | INTRAVENOUS | Status: AC
  Administered 2022-10-06: 3 mL/kg/h via INTRAVENOUS

## 2022-10-06 MED ORDER — LIDOCAINE HCL (PF) 1 % IJ SOLN
INTRAMUSCULAR | Status: AC
  Filled 2022-10-06: qty 30

## 2022-10-06 MED ORDER — MIDAZOLAM HCL 2 MG/2ML IJ SOLN
INTRAMUSCULAR | Status: AC
  Filled 2022-10-06: qty 2

## 2022-10-06 MED ORDER — ASPIRIN 81 MG PO CHEW: 81.0000 mg | CHEWABLE_TABLET | ORAL | Status: DC

## 2022-10-06 MED ORDER — FENTANYL CITRATE (PF) 100 MCG/2ML IJ SOLN
INTRAMUSCULAR | Status: DC | PRN
  Administered 2022-10-06: 25 ug via INTRAVENOUS

## 2022-10-06 MED ORDER — SODIUM CHLORIDE 0.9 % IV SOLN: 250.0000 mL | INTRAVENOUS | Status: DC | PRN

## 2022-10-06 MED ORDER — VERAPAMIL HCL 2.5 MG/ML IV SOLN
INTRAVENOUS | Status: DC | PRN
  Administered 2022-10-06: 10 mL via INTRA_ARTERIAL

## 2022-10-06 MED ORDER — ONDANSETRON HCL 4 MG/2ML IJ SOLN: 4.0000 mg | Freq: Four times a day (QID) | INTRAMUSCULAR | Status: DC | PRN

## 2022-10-06 MED ORDER — HEPARIN (PORCINE) IN NACL 1000-0.9 UT/500ML-% IV SOLN
INTRAVENOUS | Status: DC | PRN
  Administered 2022-10-06 (×2): 500 mL

## 2022-10-06 MED ORDER — FENTANYL CITRATE (PF) 100 MCG/2ML IJ SOLN
INTRAMUSCULAR | Status: AC
  Filled 2022-10-06: qty 2

## 2022-10-06 MED ORDER — HEPARIN SODIUM (PORCINE) 1000 UNIT/ML IJ SOLN
INTRAMUSCULAR | Status: AC
  Filled 2022-10-06: qty 10

## 2022-10-06 SURGICAL SUPPLY — 10 items
CATH 5FR JL3.5 JR4 ANG PIG MP (CATHETERS) IMPLANT
DEVICE RAD COMP TR BAND LRG (VASCULAR PRODUCTS) IMPLANT
GLIDESHEATH SLEND SS 6F .021 (SHEATH) IMPLANT
GUIDEWIRE INQWIRE 1.5J.035X260 (WIRE) IMPLANT
INQWIRE 1.5J .035X260CM (WIRE) ×1
KIT HEART LEFT (KITS) ×1 IMPLANT
PACK CARDIAC CATHETERIZATION (CUSTOM PROCEDURE TRAY) ×1 IMPLANT
SHEATH PROBE COVER 6X72 (BAG) IMPLANT
TRANSDUCER W/STOPCOCK (MISCELLANEOUS) ×1 IMPLANT
TUBING CIL FLEX 10 FLL-RA (TUBING) ×1 IMPLANT

## 2022-10-06 NOTE — Interval H&P Note (Signed)
History and Physical Interval Note:  10/06/2022 11:04 AM  Catherine Crawford  has presented today for surgery, with the diagnosis of abnormal stress test.  The various methods of treatment have been discussed with the patient and family. After consideration of risks, benefits and other options for treatment, the patient has consented to  Procedure(s): LEFT HEART CATH AND CORONARY ANGIOGRAPHY (N/A) as a surgical intervention.  The patient's history has been reviewed, patient examined, no change in status, stable for surgery.  I have reviewed the patient's chart and labs.  Questions were answered to the patient's satisfaction.   Cath Lab Visit (complete for each Cath Lab visit)  Clinical Evaluation Leading to the Procedure:   ACS: No.  Non-ACS:    Anginal Classification: CCS III  Anti-ischemic medical therapy: Minimal Therapy (1 class of medications)  Non-Invasive Test Results: Intermediate-risk stress test findings: cardiac mortality 1-3%/year  Prior CABG: No previous CABG        Collier Salina [facility] 10/06/2022 11:04 AM

## 2022-10-06 NOTE — Discharge Instructions (Signed)

## 2022-10-06 NOTE — Progress Notes (Signed)
TR BAND REMOVAL  LOCATION:    right radial  DEFLATED PER PROTOCOL:    Yes.    TIME BAND OFF / DRESSING APPLIED:    1415 gauze dressing applied   SITE UPON ARRIVAL:    Level 0  SITE AFTER BAND REMOVAL:    Level 0  CIRCULATION SENSATION AND MOVEMENT:    Within Normal Limits   Yes.    COMMENTS:   no new issues noted

## 2022-10-09 ENCOUNTER — Encounter (HOSPITAL_COMMUNITY): Payer: Self-pay | Admitting: Cardiology

## 2022-10-24 ENCOUNTER — Other Ambulatory Visit: Payer: Self-pay | Admitting: Cardiology

## 2022-11-23 ENCOUNTER — Ambulatory Visit: Payer: Medicare HMO | Admitting: Cardiology

## 2022-11-30 ENCOUNTER — Ambulatory Visit: Payer: Medicare HMO | Attending: Cardiology | Admitting: Cardiology

## 2022-11-30 ENCOUNTER — Encounter: Payer: Self-pay | Admitting: Cardiology

## 2022-11-30 VITALS — BP 132/76 | HR 83 | Ht 64.0 in | Wt 241.8 lb

## 2022-11-30 DIAGNOSIS — I255 Ischemic cardiomyopathy: Secondary | ICD-10-CM | POA: Diagnosis not present

## 2022-11-30 DIAGNOSIS — J418 Mixed simple and mucopurulent chronic bronchitis: Secondary | ICD-10-CM | POA: Diagnosis not present

## 2022-11-30 DIAGNOSIS — E785 Hyperlipidemia, unspecified: Secondary | ICD-10-CM | POA: Diagnosis not present

## 2022-11-30 DIAGNOSIS — I2510 Atherosclerotic heart disease of native coronary artery without angina pectoris: Secondary | ICD-10-CM | POA: Diagnosis not present

## 2022-11-30 DIAGNOSIS — I739 Peripheral vascular disease, unspecified: Secondary | ICD-10-CM

## 2022-11-30 NOTE — Progress Notes (Signed)
Cardiology Office Note:    Date:  11/30/2022   ID:  Catherine Crawford, DOB 10/24/1965, MRN [id number]  PCP:  Patient, [name]  Cardiologist:  Gypsy Balsam, MD    Referring MD: No ref. provider found   Chief Complaint  Patient presents with   Follow-up    History of Present Illness:    Catherine Crawford is a 57 y.o. female  past medical history significant for coronary artery disease, in October 2018 she did have myocardial infarction as well as acute CVA, cardiac catheterization done at that time showed significant stenosis of LAD and she received stent to the vessel as well as RCA. Her ejection fraction at that time was 20 to 25%. He was also find to have completely occluded distal aorta distally from renal arteries. She did have discussion at that time with vascular surgeons about potentially grafting her aorta however none of this has been done. Been followed here for last few years recently she presented to me with very atypical symptoms she did have some facial numbness, complaining of some pain in the chest. Interestingly she told me that was very similar symptoms that she had in 2018 when she had CVA and myocardial infarction. She did have a stress test done stress to show ischemia in LAD territory, I did CT of her head which showed some old infarct in the left parieto-occipital cortex otherwise there were no new findings.  Eventually we decided to proceed with cardiac catheterization.  On October 06, 2022 she did have a cardiac catheterization done via right radial approach.  She was found to have completely occluded distal circumflex as well as completely occluded diagonal branch with excellent collateralization.  Since that time she is doing fine.  Still described to have some numbness in her arm but otherwise doing well no chest pain tightness squeezing pressure burning chest  Past Medical History:  Diagnosis Date   Aortic occlusion (HCC) 04/16/2018   AVD (aortic valve disease)     CAD (coronary artery disease)    Cardiomyopathy (HCC)    Cervical cancer (HCC)    CHF (congestive heart failure) (HCC)    Claudication in peripheral vascular disease (HCC) 02/19/2018   Completely occluded aorta distal to renal arteries   Complication of anesthesia    Woke up once during surgery   COPD (chronic obstructive pulmonary disease) (HCC)    Coronary artery disease 02/19/2018   Status post PTCA and stenting of LAD with a 3/30 millimeters stent, stents to RCA 2.5/30 8 mm stent in October 2018 in the face of acute myocardial infarction   Depression    Dyslipidemia 02/19/2018   Essential hypertension 02/19/2018   GERD (gastroesophageal reflux disease)    Heart attack (HCC)    Hyperlipidemia    Hypertension    Late effect of cerebrovascular accident (CVA) 02/19/2018   With right side numbness as well as peripheral vision loss   PAD (peripheral artery disease) (HCC)    Stroke (HCC)    Tricuspid valve disorder    Varicosities of leg     Past Surgical History:  Procedure Laterality Date   ABDOMINAL HYSTERECTOMY  2002   CARDIAC CATHETERIZATION     CESAREAN SECTION     CHOLECYSTECTOMY N/A 05/27/2020   Procedure: LAPAROSCOPIC CHOLECYSTECTOMY;  Surgeon: Gaynelle Adu, MD;  Location: [facility];  Service: General;  Laterality: N/A;   CORONARY ANGIOPLASTY WITH STENT PLACEMENT     HERNIA REPAIR  2004   hiatal hernia, double lower abdominal hernia  LEFT HEART CATH AND CORONARY ANGIOGRAPHY N/A 10/06/2022   Procedure: LEFT HEART CATH AND CORONARY ANGIOGRAPHY;  Surgeon: [place], Peter M, MD;  Location: [facility] INVASIVE CV LAB;  Service: Cardiovascular;  Laterality: N/A;   MULTIPLE TOOTH EXTRACTIONS     PERIPHERAL ARTERIAL STENT GRAFT     x 2    Current Medications: Current Meds  Medication Sig   Ascorbic Acid (VITAMIN C) 500 MG CAPS Take 500 mg by mouth daily.   aspirin EC 81 MG tablet Take 81 mg by mouth daily.   carvedilol (COREG) 3.125 MG tablet Take 1 tablet (3.125 mg total) by mouth 2  (two) times daily with a meal.   cilostazol (PLETAL) 100 MG tablet Take 1 tablet (100 mg total) by mouth 2 (two) times daily.   clopidogrel (PLAVIX) 75 MG tablet Take 1 tablet (75 mg total) by mouth daily.   cyanocobalamin (VITAMIN B12) 500 MCG tablet Take 500 mcg by mouth daily.   ezetimibe (ZETIA) 10 MG tablet Take 1 tablet (10 mg total) by mouth daily. TAKE 1 TABLET BY MOUTH DAILY   losartan (COZAAR) 100 MG tablet Take 0.5 tablets (50 mg total) by mouth daily.   Magnesium 500 MG CAPS Take 500 mg by mouth daily.   nitroGLYCERIN (NITROSTAT) 0.4 MG SL tablet Place 1 tablet (0.4 mg total) under the tongue every 5 (five) minutes as needed. (Patient taking differently: Place 0.4 mg under the tongue every 5 (five) minutes as needed for chest pain.)   pantoprazole (PROTONIX) 40 MG tablet Take 1 tablet (40 mg total) by mouth daily.   rosuvastatin (CRESTOR) 40 MG tablet TAKE 1 TABLET BY MOUTH DAILY (Patient taking differently: Take 40 mg by mouth daily.)     Allergies:   Bee venom and Morphine and codeine   Social History   Socioeconomic History   Marital status: Married    Spouse name: Not on file   Number of children: 2   Years of education: Not on file   Highest education level: Not on file  Occupational History   Occupation: disabled  Tobacco Use   Smoking status: Former    Packs/day: 2.00    Years: 37.00    Additional pack years: 0.00    Total pack years: 74.00    Types: Cigarettes    Quit date: 04/13/2017    Years since quitting: 5.6   Smokeless tobacco: Never  Vaping Use   Vaping Use: Never used  Substance and Sexual Activity   Alcohol use: Not Currently   Drug use: Not Currently   Sexual activity: Not on file    Comment: Hysterectomy  Other Topics Concern   Not on file  Social History Narrative   One level with husband      Right handed      Caffeine none      Exercise - not really      Education 12th grade   Social Determinants of Health   Financial Resource  Strain: Not on file  Food Insecurity: Not on file  Transportation Needs: Not on file  Physical Activity: Not on file  Stress: Not on file  Social Connections: Not on file     Family History: The patient's family history includes AAA (abdominal aortic aneurysm) in her mother; Bone cancer in her maternal aunt; Brain cancer in her brother; Breast cancer in her maternal aunt; CAD in her brother, father, and mother; Cancer in her maternal aunt; Cervical cancer in her maternal aunt; Heart attack in her  father and mother; Heart attack (age of onset: 39) in her sister; Heart disease in her brother, father, mother, and sister; Liver cancer in her father; Lung cancer in her mother; Skin cancer in her maternal aunt; Uterine cancer in her maternal aunt. ROS:   Please see the history of present illness.    All 14 point review of systems negative except as described per history of present illness  EKGs/Labs/Other Studies Reviewed:      Recent Labs: 09/20/2022: ALT 14 09/29/2022: BUN 14; Creatinine, Ser 1.00; Hemoglobin 12.7; Platelets 233; Potassium 4.3; Sodium 142  Recent Lipid Panel    Component Value Date/Time   CHOL 155 09/20/2022 0915   TRIG 79 09/20/2022 0915   HDL 80 09/20/2022 0915   CHOLHDL 1.9 09/20/2022 0915   LDLCALC 60 09/20/2022 0915    Physical Exam:    VS:  BP 132/76 (BP Location: Left Arm, Patient Position: Sitting)   Pulse 83   Ht 5\' 4"  (1.626 m)   Wt 241 lb 12.8 oz (109.7 kg)   SpO2 95%   BMI 41.50 kg/m     Wt Readings from Last 3 Encounters:  11/30/22 241 lb 12.8 oz (109.7 kg)  10/06/22 237 lb (107.5 kg)  09/29/22 237 lb (107.5 kg)     GEN:  Well nourished, well developed in no acute distress HEENT: Normal NECK: No JVD; No carotid bruits LYMPHATICS: No lymphadenopathy CARDIAC: RRR, no murmurs, no rubs, no gallops RESPIRATORY:  Clear to auscultation without rales, wheezing or rhonchi  ABDOMEN: Soft, non-tender, non-distended MUSCULOSKELETAL:  No edema; No  deformity  SKIN: Warm and dry LOWER EXTREMITIES: no swelling NEUROLOGIC:  Alert and oriented x 3 PSYCHIATRIC:  Normal affect   ASSESSMENT:    1. Coronary artery disease involving native coronary artery of native heart without angina pectoris   2. Ischemic cardiomyopathy   3. Mixed simple and mucopurulent chronic bronchitis (HCC)   4. Dyslipidemia   5. Claudication in peripheral vascular disease (HCC)    PLAN:    In order of problems listed above:  Coronary artery disease stable because of extensive disease and multiple risk factors as well as peripheral vascular disease I will continue dual antiplatelets therapy.  She has no difficulty tolerating it. Dyslipidemia I did review K PN which show me LDL 60 HDL 80.  Will continue present management which include high intensity statin for of Crestor as well as Zetia. Claudications.  Not at present but she is doing well from that point review. History of smoking she abstain from smoking encouraged her to stay away from it   Medication Adjustments/Labs and Tests Ordered: Current medicines are reviewed at length with the patient today.  Concerns regarding medicines are outlined above.  No orders of the defined types were placed in this encounter.  Medication changes: No orders of the defined types were placed in this encounter.   Signed, Georgeanna Lea, MD, [facility] 11/30/2022 1:14 PM    [facility]

## 2022-11-30 NOTE — Patient Instructions (Signed)

## 2022-12-14 ENCOUNTER — Ambulatory Visit: Payer: Medicare HMO | Admitting: Cardiology

## 2023-01-19 ENCOUNTER — Other Ambulatory Visit: Payer: Self-pay | Admitting: Cardiology

## 2023-01-26 ENCOUNTER — Other Ambulatory Visit: Payer: Self-pay | Admitting: Cardiology

## 2023-02-26 ENCOUNTER — Other Ambulatory Visit: Payer: Self-pay | Admitting: Cardiology

## 2023-02-26 DIAGNOSIS — I739 Peripheral vascular disease, unspecified: Secondary | ICD-10-CM

## 2023-06-11 ENCOUNTER — Telehealth: Payer: Self-pay | Admitting: Cardiology

## 2023-06-11 NOTE — Telephone Encounter (Signed)
FYI--Patient states she will be having a colonoscopy in the near future with [facility], Dr. Iona Coach. She states their office sent a request for stat clearance to hold her blood thinner and she would like to have it processed ASAP. She states she has been bleeding from her rectum for 14 days.

## 2023-06-12 NOTE — Telephone Encounter (Signed)
Contacted the patient to get requesting providers information, no answer left a message requesting a call back with info.   Contacted the Dr Iona Coach office requesting cardiac clearance letter. I did state that I was not sure if I had the correct office but if I do to please send over (I listed info needed for clearance along with our fax number)   Advised a call back with any questions or concerns.

## 2023-06-13 NOTE — Telephone Encounter (Signed)
   Pre-operative Risk Assessment    Patient Name: Catherine Crawford  DOB: [birth year] MRN: [id number]  Last seen:11/30/2022, Dr. Bing Matter Next apt: 07/19/2023, Dr. Bing Matter    Request for Surgical Clearance     Procedure:   Colonoscopy   Date of Surgery:  Clearance TBD                                 Surgeon:  Dr.Badreddine  Surgeon's Group or Practice Name:  [facility] [place] Phone number:  [phone] Fax number:  [phone]   Type of Clearance Requested:   - Medical  - Pharmacy:  Hold Clopidogrel (Plavix) hold for 5-7 days prior    Type of Anesthesia:  Not Indicated   Additional requests/questions:   N/A  Signed, Stevan Born   06/13/2023, 2:22 PM

## 2023-06-13 NOTE — Telephone Encounter (Signed)
Hi. Dr. Bing Matter,   Patient has an upcoming colonoscopy planned for rectal bleeding and is being asked to hold Plavix for 5 days. She has a history of CAD with prior MI with stenting to RCA and LAD. I cannot find the cath report from this so details unclear. Most recent LHC in 09/2022 showed  patent LAD and RCA stents but 100% stenosis of 1st Diag and distal LCX with excellent collaterals. Can you please comment on how long Plavix can be held prior to procedure?  Please route response back to P CV DIV PREOP.  Thank you! Karlissa Aron

## 2023-06-14 NOTE — Telephone Encounter (Signed)
   Name: Catherine Crawford  DOB: [birth year]  MRN: [id number]  Primary Cardiologist: Gypsy Balsam, MD   Preoperative team, please contact this patient and set up a phone call appointment for further preoperative risk assessment. Please obtain consent and complete medication review. Thank you for your help.  I confirm that guidance regarding antiplatelet and oral anticoagulation therapy has been completed and, if necessary, noted below.  Her Plavix may be held for 5 days prior to her procedure.  Please resume as soon as hemostasis is achieved.  I also confirmed the patient resides in the state of [place]. As per [facility] telemedicine laws, the patient must reside in the state in which the provider is licensed.   Ronney Asters, NP 06/14/2023, 1:06 PM [facility]

## 2023-06-15 ENCOUNTER — Telehealth: Payer: Self-pay

## 2023-06-15 NOTE — Telephone Encounter (Signed)
Spoke with patient who is agreeable to do a tele visit on 12/10 at 2:20 pm. Med rec and consent done.

## 2023-06-15 NOTE — Telephone Encounter (Signed)
  Patient Consent for Virtual Visit        [name] has provided verbal consent on 06/15/2023 for a virtual visit (video or telephone).   CONSENT FOR VIRTUAL VISIT FOR:  Catherine Crawford  By participating in this virtual visit I agree to the following:  I hereby voluntarily request, consent and authorize [facility] and its employed or contracted physicians, physician assistants, nurse practitioners or other licensed health care professionals (the Practitioner), to provide me with telemedicine health care services (the "Services") as deemed necessary by the treating Practitioner. I acknowledge and consent to receive the Services by the Practitioner via telemedicine. I understand that the telemedicine visit will involve communicating with the Practitioner through live audiovisual communication technology and the disclosure of certain medical information by electronic transmission. I acknowledge that I have been given the opportunity to request an in-person assessment or other available alternative prior to the telemedicine visit and am voluntarily participating in the telemedicine visit.  I understand that I have the right to withhold or withdraw my consent to the use of telemedicine in the course of my care at any time, without affecting my right to future care or treatment, and that the Practitioner or I may terminate the telemedicine visit at any time. I understand that I have the right to inspect all information obtained and/or recorded in the course of the telemedicine visit and may receive copies of available information for a reasonable fee.  I understand that some of the potential risks of receiving the Services via telemedicine include:  Delay or interruption in medical evaluation due to technological equipment failure or disruption; Information transmitted may not be sufficient (e.g. poor resolution of images) to allow for appropriate medical decision making by the  Practitioner; and/or  In rare instances, security protocols could fail, causing a breach of personal health information.  Furthermore, I acknowledge that it is my responsibility to provide information about my medical history, conditions and care that is complete and accurate to the best of my ability. I acknowledge that Practitioner's advice, recommendations, and/or decision may be based on factors not within their control, such as incomplete or inaccurate data provided by me or distortions of diagnostic images or specimens that may result from electronic transmissions. I understand that the practice of medicine is not an exact science and that Practitioner makes no warranties or guarantees regarding treatment outcomes. I acknowledge that a copy of this consent can be made available to me via my patient portal [facility] MyChart), or I can request a printed copy by calling the office of [facility].    I understand that my insurance will be billed for this visit.   I have read or had this consent read to me. I understand the contents of this consent, which adequately explains the benefits and risks of the Services being provided via telemedicine.  I have been provided ample opportunity to ask questions regarding this consent and the Services and have had my questions answered to my satisfaction. I give my informed consent for the services to be provided through the use of telemedicine in my medical care

## 2023-06-19 ENCOUNTER — Ambulatory Visit: Payer: Medicare HMO

## 2023-06-20 NOTE — Telephone Encounter (Signed)
Requesting office sent duplicate request. Pt cancelled her tele visit 06/19/23 for preop clearance.   Canceled: 06/15/2023 12:59 PM 06/18/2023 8:53 AM By: By: Germain Osgood  Cancel Rsn: Patient (Pt stated "not needed, she's not worried about it anymore")    Pt does have an appt in office with Dr. Bing Matter 07/19/23. I will update all parties involved.   I will add need preop clearance to appt notes for Dr. Bing Matter.

## 2023-06-27 ENCOUNTER — Ambulatory Visit: Payer: Medicare HMO | Admitting: Cardiology

## 2023-07-19 ENCOUNTER — Ambulatory Visit: Payer: Medicare HMO | Attending: Cardiology | Admitting: Cardiology

## 2023-07-19 ENCOUNTER — Encounter: Payer: Self-pay | Admitting: Cardiology

## 2023-07-19 VITALS — BP 158/78 | HR 88 | Ht 64.0 in | Wt 237.0 lb

## 2023-07-19 DIAGNOSIS — I6930 Unspecified sequelae of cerebral infarction: Secondary | ICD-10-CM

## 2023-07-19 DIAGNOSIS — I700 Atherosclerosis of aorta: Secondary | ICD-10-CM | POA: Diagnosis not present

## 2023-07-19 DIAGNOSIS — I255 Ischemic cardiomyopathy: Secondary | ICD-10-CM | POA: Diagnosis not present

## 2023-07-19 DIAGNOSIS — E785 Hyperlipidemia, unspecified: Secondary | ICD-10-CM | POA: Diagnosis not present

## 2023-07-19 DIAGNOSIS — I2510 Atherosclerotic heart disease of native coronary artery without angina pectoris: Secondary | ICD-10-CM | POA: Diagnosis not present

## 2023-07-19 DIAGNOSIS — G459 Transient cerebral ischemic attack, unspecified: Secondary | ICD-10-CM

## 2023-07-19 MED ORDER — ISOSORBIDE MONONITRATE ER 30 MG PO TB24: 30.0000 mg | ORAL_TABLET | Freq: Every day | ORAL | 3 refills | Status: DC

## 2023-07-19 NOTE — Patient Instructions (Addendum)
 Medication Instructions:   START: Imdur 30mg  1 tablet daily   Lab Work: None Ordered If you have labs (blood work) drawn today and your tests are completely normal, you will receive your results only by: MyChart Message (if you have MyChart) OR A paper copy in the mail If you have any lab test that is abnormal or we need to change your treatment, we will call you to review the results.   Testing/Procedures: Your physician has requested that you have a carotid duplex. This test is an ultrasound of the carotid arteries in your neck. It looks at blood flow through these arteries that supply the brain with blood. Allow one hour for this exam. There are no restrictions or special instructions.    Follow-Up: At [facility], you and your health needs are our priority.  As part of our continuing mission to provide you with exceptional heart care, we have created designated Provider Care Teams.  These Care Teams include your primary Cardiologist (physician) and Advanced Practice Providers (APPs -  Physician Assistants and Nurse Practitioners) who all work together to provide you with the care you need, when you need it.  We recommend signing up for the patient portal called "MyChart".  Sign up information is provided on this After Visit Summary.  MyChart is used to connect with patients for Virtual Visits (Telemedicine).  Patients are able to view lab/test results, encounter notes, upcoming appointments, etc.  Non-urgent messages can be sent to your provider as well.   To learn more about what you can do with MyChart, go to [email or web address].    Your next appointment:   6 month(s)  The format for your next appointment:   In Person  Provider:   Gypsy Balsam, MD    Other Instructions NA

## 2023-07-19 NOTE — Progress Notes (Signed)
 Cardiology Office Note:    Date:  07/19/2023   ID:  Catherine Crawford, DOB 1965/10/20, MRN [id number]  PCP:  Patient, [name]  Cardiologist:  Lamar Fitch, MD    Referring MD: No ref. provider found   Chief Complaint  Patient presents with   Follow-up    History of Present Illness:    Catherine Crawford is a 58 y.o. female    past medical history significant for coronary artery disease, in October 2018 she did have myocardial infarction as well as acute CVA, cardiac catheterization done at that time showed significant stenosis of LAD and she received stent to the vessel as well as RCA. Her ejection fraction at that time was 20 to 25%. He was also find to have completely occluded distal aorta distally from renal arteries. She did have discussion at that time with vascular surgeons about potentially grafting her aorta however none of this has been done. Been followed here for last few years recently she presented to me with very atypical symptoms she did have some facial numbness, complaining of some pain in the chest. Interestingly she told me that was very similar symptoms that she had in 2018 when she had CVA and myocardial infarction. She did have a stress test done stress to show ischemia in LAD territory, I did CT of her head which showed some old infarct in the left parieto-occipital cortex otherwise there were no new findings.  Eventually we decided to proceed with cardiac catheterization.  On October 06, 2022 she did have a cardiac catheterization done via right radial approach.  She was found to have completely occluded distal circumflex as well as completely occluded diagonal branch with excellent collateralization.  Since that time she is doing fine.  Comes today to my office with follow-up.  She described very few episode of chest pain happens maybe 3 times since I seen her last time.  Couple additional developments of her symptoms she did have a look like fresh red blood from her rectum  she thinks she got into hemorrhoids.  She was scheduled to have colonoscopy however never had it done bleeding stopped.  She still on dual antiplatelet therapy.  Also described to have episode of blurred vision couple weeks ago.  Lasted for about 5 minutes.  Otherwise doing fine  Past Medical History:  Diagnosis Date   Aortic occlusion (HCC) 04/16/2018   AVD (aortic valve disease)    CAD (coronary artery disease)    Cardiomyopathy (HCC)    Cervical cancer (HCC)    CHF (congestive heart failure) (HCC)    Claudication in peripheral vascular disease (HCC) 02/19/2018   Completely occluded aorta distal to renal arteries   Complication of anesthesia    Woke up once during surgery   COPD (chronic obstructive pulmonary disease) (HCC)    Coronary artery disease 02/19/2018   Status post PTCA and stenting of LAD with a 3/30 millimeters stent, stents to RCA 2.5/30 8 mm stent in October 2018 in the face of acute myocardial infarction   Depression    Dyslipidemia 02/19/2018   Essential hypertension 02/19/2018   GERD (gastroesophageal reflux disease)    Heart attack (HCC)    Hyperlipidemia    Hypertension    Late effect of cerebrovascular accident (CVA) 02/19/2018   With right side numbness as well as peripheral vision loss   PAD (peripheral artery disease) (HCC)    Stroke (HCC)    Tricuspid valve disorder    Varicosities of leg  Past Surgical History:  Procedure Laterality Date   ABDOMINAL HYSTERECTOMY  2002   CARDIAC CATHETERIZATION     CESAREAN SECTION     CHOLECYSTECTOMY N/A 05/27/2020   Procedure: LAPAROSCOPIC CHOLECYSTECTOMY;  Surgeon: Tanda Locus, MD;  Location: [facility];  Service: General;  Laterality: N/A;   CORONARY ANGIOPLASTY WITH STENT PLACEMENT     HERNIA REPAIR  2004   hiatal hernia, double lower abdominal hernia   LEFT HEART CATH AND CORONARY ANGIOGRAPHY N/A 10/06/2022   Procedure: LEFT HEART CATH AND CORONARY ANGIOGRAPHY;  Surgeon: Jordan, Peter M, MD;  Location: [facility] INVASIVE  CV LAB;  Service: Cardiovascular;  Laterality: N/A;   MULTIPLE TOOTH EXTRACTIONS     PERIPHERAL ARTERIAL STENT GRAFT     x 2    Current Medications: Current Meds  Medication Sig   Ascorbic Acid (VITAMIN C) 500 MG CAPS Take 500 mg by mouth daily.   aspirin  EC 81 MG tablet Take 81 mg by mouth daily.   carvedilol  (COREG ) 3.125 MG tablet TAKE 1 TABLET(3.125 MG) BY MOUTH TWICE DAILY WITH A MEAL (Patient taking differently: Take 3.125 mg by mouth 2 (two) times daily with a meal.)   cilostazol  (PLETAL ) 100 MG tablet TAKE 1 TABLET(100 MG) BY MOUTH TWICE DAILY (Patient taking differently: Take 100 mg by mouth 2 (two) times daily.)   clopidogrel  (PLAVIX ) 75 MG tablet TAKE 1 TABLET(75 MG) BY MOUTH DAILY (Patient taking differently: Take 75 mg by mouth daily.)   cyanocobalamin (VITAMIN B12) 500 MCG tablet Take 500 mcg by mouth daily.   ezetimibe  (ZETIA ) 10 MG tablet TAKE 1 TABLET(10 MG) BY MOUTH DAILY (Patient taking differently: Take 10 mg by mouth daily.)   losartan  (COZAAR ) 100 MG tablet Take 0.5 tablets (50 mg total) by mouth daily.   Magnesium 500 MG CAPS Take 500 mg by mouth daily.   nitroGLYCERIN  (NITROSTAT ) 0.4 MG SL tablet Place 1 tablet (0.4 mg total) under the tongue every 5 (five) minutes as needed. (Patient taking differently: Place 0.4 mg under the tongue every 5 (five) minutes as needed for chest pain.)   pantoprazole  (PROTONIX ) 40 MG tablet TAKE 1 TABLET(40 MG) BY MOUTH DAILY (Patient taking differently: Take 40 mg by mouth daily.)   rosuvastatin  (CRESTOR ) 40 MG tablet TAKE 1 TABLET BY MOUTH DAILY (Patient taking differently: Take 40 mg by mouth daily.)     Allergies:   Bee venom and Morphine and codeine   Social History   Socioeconomic History   Marital status: Married    Spouse name: Not on file   Number of children: 2   Years of education: Not on file   Highest education level: Not on file  Occupational History   Occupation: disabled  Tobacco Use   Smoking status: Former     Current packs/day: 0.00    Average packs/day: 2.0 packs/day for 37.0 years (74.0 ttl pk-yrs)    Types: Cigarettes    Start date: 04/13/1980    Quit date: 04/13/2017    Years since quitting: 6.2   Smokeless tobacco: Never  Vaping Use   Vaping status: Never Used  Substance and Sexual Activity   Alcohol use: Not Currently   Drug use: Not Currently   Sexual activity: Not on file    Comment: Hysterectomy  Other Topics Concern   Not on file  Social History Narrative   One level with husband      Right handed      Caffeine none      Exercise -  not really      Education 12th grade   Social Drivers of Corporate Investment Banker Strain: Not on file  Food Insecurity: Not on file  Transportation Needs: Not on file  Physical Activity: Not on file  Stress: Not on file  Social Connections: Not on file     Family History: The patient's family history includes AAA (abdominal aortic aneurysm) in her mother; Bone cancer in her maternal aunt; Brain cancer in her brother; Breast cancer in her maternal aunt; CAD in her brother, father, and mother; Cancer in her maternal aunt; Cervical cancer in her maternal aunt; Heart attack in her father and mother; Heart attack (age of onset: 60) in her sister; Heart disease in her brother, father, mother, and sister; Liver cancer in her father; Lung cancer in her mother; Skin cancer in her maternal aunt; Uterine cancer in her maternal aunt. ROS:   Please see the history of present illness.    All 14 point review of systems negative except as described per history of present illness  EKGs/Labs/Other Studies Reviewed:         Recent Labs: 09/20/2022: ALT 14 09/29/2022: BUN 14; Creatinine, Ser 1.00; Hemoglobin 12.7; Platelets 233; Potassium 4.3; Sodium 142  Recent Lipid Panel    Component Value Date/Time   CHOL 155 09/20/2022 0915   TRIG 79 09/20/2022 0915   HDL 80 09/20/2022 0915   CHOLHDL 1.9 09/20/2022 0915   LDLCALC 60 09/20/2022 0915     Physical Exam:    VS:  BP (!) 158/78 (BP Location: Right Arm, Patient Position: Sitting)   Pulse 88   Ht 5' 4 (1.626 m)   Wt 237 lb (107.5 kg)   SpO2 96%   BMI 40.68 kg/m     Wt Readings from Last 3 Encounters:  07/19/23 237 lb (107.5 kg)  11/30/22 241 lb 12.8 oz (109.7 kg)  10/06/22 237 lb (107.5 kg)     GEN:  Well nourished, well developed in no acute distress HEENT: Normal NECK: No JVD; No carotid bruits LYMPHATICS: No lymphadenopathy CARDIAC: RRR, no murmurs, no rubs, no gallops RESPIRATORY:  Clear to auscultation without rales, wheezing or rhonchi  ABDOMEN: Soft, non-tender, non-distended MUSCULOSKELETAL:  No edema; No deformity  SKIN: Warm and dry LOWER EXTREMITIES: no swelling NEUROLOGIC:  Alert and oriented x 3 PSYCHIATRIC:  Normal affect   ASSESSMENT:    1. Aortic occlusion (HCC)   2. Arteriosclerosis of coronary artery   3. Ischemic cardiomyopathy   4. Dyslipidemia   5. Late effect of cerebrovascular accident (CVA)    PLAN:    In order of problems listed above:  Aortic occlusion: Noted, not much claudication continue monitoring. Coronary artery disease stable from that point review continue present management. Coronary disease advance to rare episode of chest pain I will add Imdur  30 to medical regiment for 2 reasons help her with the blood pressure as well as chest pain. Essential hypertension uncontrolled Inderal as needed. Blurred vision will schedule at carotic ultrasounds. Late effect of CVA on more mild episode that she had potential TIA dual antiplatelet therapy, high intense statin will continue.  Will get cardiac ultrasound   Medication Adjustments/Labs and Tests Ordered: Current medicines are reviewed at length with the patient today.  Concerns regarding medicines are outlined above.  No orders of the defined types were placed in this encounter.  Medication changes: No orders of the defined types were placed in this  encounter.   Signed, Lamar DOROTHA Fitch, MD,  [facility] 07/19/2023 8:20 AM    [facility]

## 2023-08-11 ENCOUNTER — Other Ambulatory Visit: Payer: Self-pay | Admitting: Cardiology

## 2023-08-13 ENCOUNTER — Telehealth: Payer: Self-pay | Admitting: Cardiology

## 2023-08-13 ENCOUNTER — Ambulatory Visit: Payer: Medicare HMO | Attending: Cardiology

## 2023-08-13 DIAGNOSIS — G459 Transient cerebral ischemic attack, unspecified: Secondary | ICD-10-CM

## 2023-08-13 MED ORDER — EZETIMIBE 10 MG PO TABS: 10.0000 mg | ORAL_TABLET | Freq: Every day | ORAL | 3 refills | Status: AC

## 2023-08-13 NOTE — Telephone Encounter (Signed)
Patient is needing a refill on her Ezetimibe 10mg  sent to the [facility] on [address]. CB # [phone]

## 2023-08-13 NOTE — Telephone Encounter (Signed)
Left vm that RX has been sent

## 2023-08-16 ENCOUNTER — Telehealth: Payer: Self-pay

## 2023-08-16 ENCOUNTER — Telehealth: Payer: Self-pay | Admitting: Cardiology

## 2023-08-16 NOTE — Telephone Encounter (Signed)
Patient is calling in about her results. Please advise

## 2023-08-16 NOTE — Telephone Encounter (Signed)
LVM per DPR- per Dr. Wendy Poet note regarding normal U/S results. Encouraged to call with any questions. Routed to PCP.

## 2023-08-23 ENCOUNTER — Telehealth: Payer: Self-pay | Admitting: Pharmacy Technician

## 2023-08-23 ENCOUNTER — Telehealth: Payer: Self-pay | Admitting: Cardiology

## 2023-08-23 ENCOUNTER — Other Ambulatory Visit (HOSPITAL_COMMUNITY): Payer: Self-pay

## 2023-08-23 NOTE — Telephone Encounter (Signed)
Patient is returning RN's call.   She states the office needs to call [employer] to advise she is allowed to take the medication so it is authorized and does not cost $110.  She is requesting a callback with an update once Berkley Harvey is approved.

## 2023-08-23 NOTE — Telephone Encounter (Signed)
Pt aware that RX has been approved.

## 2023-08-23 NOTE — Telephone Encounter (Signed)
Pharmacy Patient Advocate Encounter  Received notification from [facility] that Prior Authorization for cilostazol has been APPROVED from 08/23/23 to 07/09/24. Spoke to pharmacy to process.Copay is $15.00 for 90 days.    PA #/Case ID/Reference #: [id number]

## 2023-08-23 NOTE — Telephone Encounter (Signed)
Pt c/o medication issue:  1. Name of Medication:   cilostazol (PLETAL) 100 MG tablet    2. How are you currently taking this medication (dosage and times per day)?   TAKE 1 TABLET(100 MG) BY MOUTH TWICE DAILYPatient taking differently: Take 100 mg by mouth 2 (two) times daily.    3. Are you having a reaction (difficulty breathing--STAT)? No  4. What is your medication issue? Pt states that her insurance will not cover medication until something is sent stating that she needs it. Pt would like a c/b regarding this matter. Please advise

## 2023-08-23 NOTE — Telephone Encounter (Signed)
Left vm to return call.

## 2023-08-23 NOTE — Telephone Encounter (Signed)
Pharmacy Patient Advocate Encounter   Received notification from Pt Calls Messages that prior authorization for Cilostazol 100MG  tablets is required/requested.   Insurance verification completed.   The patient is insured through [employer] .   Per test claim: PA required; PA submitted to above mentioned insurance via CoverMyMeds Key/confirmation #/EOC [id number] Status is pending

## 2023-08-23 NOTE — Telephone Encounter (Signed)
Pt needs a PA for Pletal.

## 2023-08-30 ENCOUNTER — Telehealth: Payer: Self-pay | Admitting: Cardiology

## 2023-08-30 NOTE — Telephone Encounter (Signed)
 Pt requesting cb to see if she needs to f/u with Dr Kirtland Bouchard since being discharged from hosp for influenza and no longer has a pcp

## 2023-08-30 NOTE — Telephone Encounter (Signed)
 Spoke with pt, Advised per Dr. Bing Matter that she would need to follow up with Her PCP for a hospital follow up for Flu.

## 2023-09-03 ENCOUNTER — Other Ambulatory Visit: Payer: Self-pay | Admitting: Cardiology

## 2023-11-08 ENCOUNTER — Other Ambulatory Visit: Payer: Self-pay

## 2023-11-08 ENCOUNTER — Telehealth: Payer: Self-pay | Admitting: Cardiology

## 2023-11-08 DIAGNOSIS — I739 Peripheral vascular disease, unspecified: Secondary | ICD-10-CM

## 2023-11-08 MED ORDER — CILOSTAZOL 100 MG PO TABS: 100.0000 mg | ORAL_TABLET | Freq: Two times a day (BID) | ORAL | 3 refills | Status: DC

## 2023-11-08 NOTE — Telephone Encounter (Signed)
*  STAT* If patient is at the pharmacy, call can be transferred to refill team.   1. Which medications need to be refilled? (please list name of each medication and dose if known)   cilostazol  (PLETAL ) 100 MG tablet     4. Which pharmacy/location (including street and city if local pharmacy) is medication to be sent to? [id number] - [place], [place] - [address]    5. Do they need a 30 day or 90 day supply? 90

## 2023-11-08 NOTE — Telephone Encounter (Signed)
 Pletal  100mg  1 Bid #[id number] ref x 3 sent to [facility] [address].

## 2023-11-20 ENCOUNTER — Other Ambulatory Visit (HOSPITAL_COMMUNITY): Payer: Self-pay

## 2023-11-22 ENCOUNTER — Telehealth: Payer: Self-pay | Admitting: Pharmacy Technician

## 2024-03-13 ENCOUNTER — Other Ambulatory Visit: Payer: Self-pay | Admitting: Cardiology

## 2024-03-13 DIAGNOSIS — I739 Peripheral vascular disease, unspecified: Secondary | ICD-10-CM

## 2024-04-10 ENCOUNTER — Encounter: Payer: Self-pay | Admitting: Cardiology

## 2024-04-10 ENCOUNTER — Telehealth: Payer: Self-pay

## 2024-04-10 ENCOUNTER — Ambulatory Visit: Attending: Cardiology | Admitting: Cardiology

## 2024-04-10 VITALS — BP 132/76 | HR 82 | Ht 64.0 in | Wt 222.2 lb

## 2024-04-10 DIAGNOSIS — I739 Peripheral vascular disease, unspecified: Secondary | ICD-10-CM

## 2024-04-10 DIAGNOSIS — I2510 Atherosclerotic heart disease of native coronary artery without angina pectoris: Secondary | ICD-10-CM | POA: Diagnosis not present

## 2024-04-10 DIAGNOSIS — E785 Hyperlipidemia, unspecified: Secondary | ICD-10-CM | POA: Diagnosis not present

## 2024-04-10 DIAGNOSIS — J418 Mixed simple and mucopurulent chronic bronchitis: Secondary | ICD-10-CM

## 2024-04-10 NOTE — Telephone Encounter (Signed)
 To clarify: I have seen the forms come from the GI office already indicating risk, such as in this pt: Form brought to office Dr. Krasowski 5-7 day hold of Plavix  is approved, Medium risk checked and without a lovenox  bridge is checked.   Also indicating no need for Lovenox .   I will however call the office tomorrow and confirm all of this information.

## 2024-04-10 NOTE — Telephone Encounter (Signed)
 Niels - can you please clarify comments on this request? Was the form already filled out and boxes checked by our office?

## 2024-04-10 NOTE — Telephone Encounter (Signed)
 I have also sent a secure chat to Olam DEL RN who placed the clearance request in the chart. I have Olam to reach out the preop team tomorrow to see if she can help clarify the notes in question per the preop APP.

## 2024-04-10 NOTE — Telephone Encounter (Signed)
 Secure with Olam RN: It was my understanding that a form was to be entered even if they brought the form with them to the office to be filled out by the physician. Dr. Bernie filled it out Form brought to office Dr. Bernie 5-7 day hold of Plavix  is approved, Medium risk checked and without a lovenox  bridge is checked. These are the things Dr. Krasowski checked off and approved. Im sorry for any confusion.   Me: no worries, so the pt has been cleared?   Lisa: Yes ma'am    I will update the preop APP the pt has been cleared.

## 2024-04-10 NOTE — Progress Notes (Signed)
 Cardiology Office Note:    Date:  04/10/2024   ID:  Catherine Crawford, DOB 1965-12-14, MRN [id number]  PCP:  Patient, [name]  Cardiologist:  Lamar Fitch, MD    Referring MD: No ref. provider found   No chief complaint on file.   History of Present Illness:    Catherine Crawford is a 58 y.o. female   past medical history significant for coronary artery disease, in October 2018 she did have myocardial infarction as well as acute CVA, cardiac catheterization done at that time showed significant stenosis of LAD and she received stent to the vessel as well as RCA. Her ejection fraction at that time was 20 to 25%. He was also find to have completely occluded distal aorta distally from renal arteries. She did have discussion at that time with vascular surgeons about potentially grafting her aorta however none of this has been done. Been followed here for last few years recently she presented to me with very atypical symptoms she did have some facial numbness, complaining of some pain in the chest. Interestingly she told me that was very similar symptoms that she had in 2018 when she had CVA and myocardial infarction. She did have a stress test done stress to show ischemia in LAD territory, I did CT of her head which showed some old infarct in the left parieto-occipital cortex otherwise there were no new findings.  Eventually we decided to proceed with cardiac catheterization.  On October 06, 2022 she did have a cardiac catheterization done via right radial approach.  She was found to have completely occluded distal circumflex as well as completely occluded diagonal branch with excellent collateralization.  She comes today 2 months for follow-up.  Since the time of seeing him last time she is still complaining of having some right flank pain.  Initial suspicion was UTI she was given IV antibiotics [place] recover but still some swelling and sensation of fullness as well as pain in the right side of her  abdomen, she did have CT of her abdomen done nothing was found that could explain her symptoms.  There is not much correlation with eating.  This sensation is there all the time sometimes gets worse.  Describe also to have some chest pain but that typically happen when she gets very upset and otherwise seems to be doing fine she tried to vape some but stopped doing it now she does not smoke does not vape   Past Medical History:  Diagnosis Date   Aortic occlusion 04/16/2018   AVD (aortic valve disease)    CAD (coronary artery disease)    Cardiomyopathy (HCC)    Cervical cancer (HCC)    CHF (congestive heart failure) (HCC)    Claudication in peripheral vascular disease 02/19/2018   Completely occluded aorta distal to renal arteries   Complication of anesthesia    Woke up once during surgery   COPD (chronic obstructive pulmonary disease) (HCC)    Coronary artery disease 02/19/2018   Status post PTCA and stenting of LAD with a 3/30 millimeters stent, stents to RCA 2.5/30 8 mm stent in October 2018 in the face of acute myocardial infarction   Depression    Dyslipidemia 02/19/2018   Essential hypertension 02/19/2018   GERD (gastroesophageal reflux disease)    Heart attack (HCC)    Hyperlipidemia    Hypertension    Late effect of cerebrovascular accident (CVA) 02/19/2018   With right side numbness as well as peripheral vision loss  PAD (peripheral artery disease)    Stroke [facility]    Tricuspid valve disorder    Varicosities of leg     Past Surgical History:  Procedure Laterality Date   ABDOMINAL HYSTERECTOMY  2002   CARDIAC CATHETERIZATION     CESAREAN SECTION     CHOLECYSTECTOMY N/A 05/27/2020   Procedure: LAPAROSCOPIC CHOLECYSTECTOMY;  Surgeon: Tanda Locus, MD;  Location: [facility];  Service: General;  Laterality: N/A;   CORONARY ANGIOPLASTY WITH STENT PLACEMENT     HERNIA REPAIR  2004   hiatal hernia, double lower abdominal hernia   LEFT HEART CATH AND CORONARY ANGIOGRAPHY N/A  10/06/2022   Procedure: LEFT HEART CATH AND CORONARY ANGIOGRAPHY;  Surgeon: [place]azil[place], Peter M, MD;  Location: [facility] INVASIVE CV LAB;  Service: Cardiovascular;  Laterality: N/A;   MULTIPLE TOOTH EXTRACTIONS     PERIPHERAL ARTERIAL STENT GRAFT     x 2    Current Medications: Current Meds  Medication Sig   aspirin  EC 81 MG tablet Take 81 mg by mouth daily.   carvedilol  (COREG ) 3.125 MG tablet Take 1 tablet (3.125 mg total) by mouth 2 (two) times daily with a meal.   cilostazol  (PLETAL ) 100 MG tablet TAKE 1 TABLET(100 MG) BY MOUTH TWICE DAILY   clopidogrel  (PLAVIX ) 75 MG tablet TAKE 1 TABLET(75 MG) BY MOUTH DAILY   cyanocobalamin (VITAMIN B12) 500 MCG tablet Take 500 mcg by mouth daily.   ezetimibe  (ZETIA ) 10 MG tablet Take 1 tablet (10 mg total) by mouth daily. TAKE 1 TABLET(10 MG) BY MOUTH DAILY   isosorbide  mononitrate (IMDUR ) 30 MG 24 hr tablet Take 1 tablet (30 mg total) by mouth daily.   losartan  (COZAAR ) 100 MG tablet Take 0.5 tablets (50 mg total) by mouth daily.   Magnesium 500 MG CAPS Take 500 mg by mouth daily.   nitroGLYCERIN  (NITROSTAT ) 0.4 MG SL tablet Place 1 tablet (0.4 mg total) under the tongue every 5 (five) minutes as needed.   pantoprazole  (PROTONIX ) 40 MG tablet Take 1 tablet (40 mg total) by mouth daily.   rosuvastatin  (CRESTOR ) 40 MG tablet Take 1 tablet (40 mg total) by mouth daily.   [DISCONTINUED] Ascorbic Acid (VITAMIN C) 500 MG CAPS Take 500 mg by mouth daily.     Allergies:   Bee venom and Morphine and codeine   Social History   Socioeconomic History   Marital status: Married    Spouse name: Not on file   Number of children: 2   Years of education: Not on file   Highest education level: Not on file  Occupational History   Occupation: disabled  Tobacco Use   Smoking status: Former    Current packs/day: 0.00    Average packs/day: 2.0 packs/day for 37.0 years (74.0 ttl pk-yrs)    Types: Cigarettes    Start date: 04/13/1980    Quit date: 04/13/2017    Years  since quitting: 6.9   Smokeless tobacco: Never  Vaping Use   Vaping status: Never Used  Substance and Sexual Activity   Alcohol use: Not Currently   Drug use: Not Currently   Sexual activity: Not on file    Comment: Hysterectomy  Other Topics Concern   Not on file  Social History Narrative   One level with husband      Right handed      Caffeine none      Exercise - not really      Education 12th grade   Social Drivers of Health  Financial Resource Strain: Not on file  Food Insecurity: Low Risk  (03/27/2024)   Received from [facility]   Hunger Vital Sign    Within the past 12 months, you worried that your food would run out before you got money to buy more: Never true    Within the past 12 months, the food you bought just didn't last and you didn't have money to get more. : Never true  Transportation Needs: No Transportation Needs (03/27/2024)   Received from [facility]    In the past 12 months, has lack of reliable transportation kept you from medical appointments, meetings, work or from getting things needed for daily living? : No  Physical Activity: Not on file  Stress: Not on file  Social Connections: Not on file     Family History: The patient's family history includes AAA (abdominal aortic aneurysm) in her mother; Bone cancer in her maternal aunt; Brain cancer in her brother; Breast cancer in her maternal aunt; CAD in her brother, father, and mother; Cancer in her maternal aunt; Cervical cancer in her maternal aunt; Heart attack in her father and mother; Heart attack (age of onset: 63) in her sister; Heart disease in her brother, father, mother, and sister; Liver cancer in her father; Lung cancer in her mother; Skin cancer in her maternal aunt; Uterine cancer in her maternal aunt. ROS:   Please see the history of present illness.    All 14 point review of systems negative except as described per history of present illness  EKGs/Labs/Other  Studies Reviewed:    EKG Interpretation Date/Time:  Thursday April 10 2024 15:05:02 EDT Ventricular Rate:  82 PR Interval:  158 QRS Duration:  78 QT Interval:  362 QTC Calculation: 422 R Axis:   -22  Text Interpretation: Normal sinus rhythm Low voltage QRS Cannot rule out Inferior infarct , age undetermined Abnormal ECG No previous ECGs available Confirmed by Bernie Charleston [phone]) on 04/10/2024 3:26:33 PM    Recent Labs: No results found for requested labs within last 365 days.  Recent Lipid Panel    Component Value Date/Time   CHOL 155 09/20/2022 0915   TRIG 79 09/20/2022 0915   HDL 80 09/20/2022 0915   CHOLHDL 1.9 09/20/2022 0915   LDLCALC 60 09/20/2022 0915    Physical Exam:    VS:  BP 132/76   Pulse 82   Ht 5' 4 (1.626 m)   Wt 222 lb 3.2 oz (100.8 kg)   SpO2 96%   BMI 38.14 kg/m     Wt Readings from Last 3 Encounters:  04/10/24 222 lb 3.2 oz (100.8 kg)  07/19/23 237 lb (107.5 kg)  11/30/22 241 lb 12.8 oz (109.7 kg)     GEN:  Well nourished, well developed in no acute distress HEENT: Normal NECK: No JVD; No carotid bruits LYMPHATICS: No lymphadenopathy CARDIAC: RRR, no murmurs, no rubs, no gallops RESPIRATORY:  Clear to auscultation without rales, wheezing or rhonchi  ABDOMEN: Soft, non-tender, non-distended MUSCULOSKELETAL:  No edema; No deformity  SKIN: Warm and dry LOWER EXTREMITIES: no swelling NEUROLOGIC:  Alert and oriented x 3 PSYCHIATRIC:  Normal affect   ASSESSMENT:    1. Dyslipidemia   2. Coronary artery disease involving native coronary artery of native heart without angina pectoris   3. PAD (peripheral artery disease)   4. Mixed simple and mucopurulent chronic bronchitis (HCC)    PLAN:    In order of problems listed above:  Advanced referral peripheral  vascular disease with occluded aortic, her symptomatology is not typical for vascular or ischemic pain from her cats but that may need to be investigated, I will ask her to have  ultrasounds duplex study of her celiac superior mesenteric and inferior mesenteric artery. Coronary to disease.  Stable will give her prescription for nitroglycerin . Dyslipidemia she is on statin high intensity which I continue, we will make arrangements for fasting profile to be repeated   Medication Adjustments/Labs and Tests Ordered: Current medicines are reviewed at length with the patient today.  Concerns regarding medicines are outlined above.  Orders Placed This Encounter  Procedures   EKG 12-Lead   Medication changes: No orders of the defined types were placed in this encounter.   Signed, Lamar DOROTHA Fitch, MD, [facility] 04/10/2024 3:39 PM    [facility]

## 2024-04-10 NOTE — Telephone Encounter (Signed)
   Pre-operative Risk Assessment    Patient Name: Catherine Crawford  DOB: 02-Jan-1966 MRN: [id number]  Date of last office visit: 04-10-2024 Date of next office visit: 3 month follow up   Request for Surgical Clearance  Procedure:  Colonoscopy, EGD   Date of Surgery:  Clearance TBD                                  Surgeon:  Dr. Murriel Surgeon's Group or Practice Name:  [facility] [place] Phone number:  [phone] Fax number:  [phone]  Type of Clearance Requested:   - Pharmacy:  Hold Clopidogrel  (Plavix ) 5- 7 days prior to procedure   Type of Anesthesia:  Not Indicated  Additional requests/questions:  Form brought to office Dr. Bernie 5-7 day hold of Plavix  is approved, Medium risk checked and without a lovenox  bridge is checked.  Signed, Olam JONETTA Lesches   04/10/2024, 3:50 PM

## 2024-04-10 NOTE — Patient Instructions (Signed)

## 2024-04-11 NOTE — Telephone Encounter (Signed)
   Patient Name: Catherine Crawford  DOB: 01/03/1966 MRN: [id number]  Primary Cardiologist: Lamar Fitch, MD  Chart reviewed as part of pre-operative protocol coverage. Given past medical history and time since last visit, based on ACC/AHA guidelines, Catherine Crawford is at acceptable risk for the planned procedure without further cardiovascular testing.   Form brought to office Dr. Krasowski 5-7 day hold of Plavix  is approved, Medium risk checked and without a lovenox  bridge is checked. These are the things Dr. Krasowski checked off and approved. Patient is cleared.  The patient was advised that if she develops new symptoms prior to surgery to contact our office to arrange for a follow-up visit, and she verbalized understanding.  I will route this recommendation to the requesting party via Epic fax function and remove from pre-op pool.  Please call with questions.  Lamarr Satterfield, NP 04/11/2024, 8:17 AM

## 2024-04-14 ENCOUNTER — Telehealth: Payer: Self-pay | Admitting: Cardiology

## 2024-04-14 NOTE — Telephone Encounter (Signed)
 Patient called to follow-up on orders for her ultrasound.

## 2024-04-14 NOTE — Telephone Encounter (Signed)
 Spoke with pt. Advised test will be ordered today and someone will call to schedule.

## 2024-04-14 NOTE — Addendum Note (Signed)
 Addended by: ARLOA PLANAS D on: 04/14/2024 09:43 AM   Modules accepted: Orders

## 2024-05-06 ENCOUNTER — Ambulatory Visit: Attending: Cardiology

## 2024-05-06 DIAGNOSIS — I739 Peripheral vascular disease, unspecified: Secondary | ICD-10-CM

## 2024-05-07 ENCOUNTER — Ambulatory Visit: Payer: Self-pay | Admitting: Cardiology

## 2024-05-09 ENCOUNTER — Telehealth: Payer: Self-pay

## 2024-05-09 ENCOUNTER — Telehealth: Payer: Self-pay | Admitting: Cardiology

## 2024-05-09 DIAGNOSIS — I2510 Atherosclerotic heart disease of native coronary artery without angina pectoris: Secondary | ICD-10-CM

## 2024-05-09 DIAGNOSIS — I255 Ischemic cardiomyopathy: Secondary | ICD-10-CM

## 2024-05-09 NOTE — Telephone Encounter (Signed)
 Referral made to Pharm D to discuss GLP 1

## 2024-05-09 NOTE — Telephone Encounter (Signed)
 Pt c/o medication issue:  1. Name of Medication:  Zepbound  2. How are you currently taking this medication (dosage and times per day)?   3. Are you having a reaction (difficulty breathing--STAT)?   4. What is your medication issue?    Patient says PCP recommended Zepbound but insurance denied. Patient would like to know if Dr. Krasowski is able to have it authorized for her. She says she would like to avoid becoming diabetic if possible.

## 2024-05-09 NOTE — Telephone Encounter (Signed)
 Patient is requesting a call back to review results.

## 2024-05-09 NOTE — Telephone Encounter (Signed)
 Left message on My Chart with [facility]  results per Dr. Karry note. Routed to PCP

## 2024-05-12 ENCOUNTER — Other Ambulatory Visit: Payer: Self-pay | Admitting: Cardiology

## 2024-07-01 ENCOUNTER — Encounter: Payer: Self-pay | Admitting: Pharmacist Clinician (PhC)/ Clinical Pharmacy Specialist

## 2024-07-01 ENCOUNTER — Telehealth: Payer: Self-pay | Admitting: Pharmacist Clinician (PhC)/ Clinical Pharmacy Specialist

## 2024-07-01 ENCOUNTER — Ambulatory Visit: Attending: Cardiology | Admitting: Pharmacist Clinician (PhC)/ Clinical Pharmacy Specialist

## 2024-07-01 ENCOUNTER — Other Ambulatory Visit (HOSPITAL_COMMUNITY): Payer: Self-pay

## 2024-07-01 ENCOUNTER — Telehealth: Payer: Self-pay | Admitting: Pharmacy Technician

## 2024-07-01 VITALS — Ht 64.0 in | Wt 222.4 lb

## 2024-07-01 DIAGNOSIS — E669 Obesity, unspecified: Secondary | ICD-10-CM

## 2024-07-01 HISTORY — DX: Obesity, unspecified: E66.9

## 2024-07-01 NOTE — Telephone Encounter (Signed)
 Pharmacy Patient Advocate Encounter  Received notification from [employer] that Prior Authorization for wegovy has been APPROVED from 07/11/23 to 07/09/25. Ran test claim, Copay is $703.42- one month. This test claim was processed through [facility]- copay amounts may vary at other pharmacies due to pharmacy/plan contracts, or as the patient moves through the different stages of their insurance plan.   PA #/Case ID/Reference #: [id number]

## 2024-07-01 NOTE — Telephone Encounter (Signed)
 Please do PA for [facility], has hx of MI, CVA and peripheral stenting.

## 2024-07-01 NOTE — Patient Instructions (Signed)
 We will start the prior authorization process to get Wegovy covered by your insurance.   TIPS FOR SUCCESS Write down the reasons why you want to lose weight and post it in a place where you'll see it often. Start small and work your way up. Keep in mind that it takes time to achieve goals, and small steps add up. Any additional movements help to burn calories. Taking the stairs rather than the elevator and parking at the far end of your parking lot are easy ways to start. Brisk walking for at least 30 minutes 4 or more days of the week is an excellent goal to work toward  [employer] WHAT IT MEANS TO FEEL FULL Did you know that it can take 15 minutes or more for your brain to receive the message that you've eaten? That means that, if you eat less food, but consume it slower, you may still feel satisfied. Eating a lot of fruits and vegetables can also help you feel fuller. Eat off of smaller plates so that moderate portions don't seem too small  TITRATION PLAN Will plan to follow the titration plan as below, pending patient is tolerating each dose before increasing to the next. Can slow titration if needed for tolerability.   If you have any questions or concerns, please reach out to us .  Omair Dettmer at [phone] (you can leave a message and I will respond when I am in the office - 3-4 days per week)  THANK YOU FOR CHOOSING [facility]

## 2024-07-01 NOTE — Telephone Encounter (Signed)
 Pharmacy Patient Advocate Encounter   Received notification from Physician's Office that prior authorization for wegovy 0.25 MG is required/requested.   Insurance verification completed.   The patient is insured through [employer].   Per test claim: PA required; PA submitted to above mentioned insurance via Latent Key/confirmation #/EOC [id number] Status is pending

## 2024-07-01 NOTE — Assessment & Plan Note (Signed)
" °  Patient has not met goal of at least 5% of body weight loss with comprehensive lifestyle modifications alone in the past 3-6 months. Pharmacotherapy is appropriate to pursue as augmentation. Will start Wegovy.   Confirmed patient not pregnant and no personal or family history of medullary thyroid  carcinoma (MTC) or Multiple Endocrine Neoplasia syndrome type 2 (MEN 2).   Advised patient on common side effects including nausea, diarrhea, dyspepsia, decreased appetite, and fatigue. Counseled patient on reducing meal size and how to titrate medication to minimize side effects. Patient aware to call if intolerable side effects or if experiencing dehydration, abdominal pain, or dizziness. Patient will adhere to dietary modifications and will target at least 150 minutes of moderate intensity exercise weekly, plus resistance training twice a week (as recommended by the American Heart Association). This resistance training--such as weightlifting, bodyweight exercises, or using resistance bands, adapted to the patient's ability--will help prevent muscle loss.  Injection technique reviewed at today's visit.    Follow up in 1-2 days regarding coverage of Wegovy . If therapy is initiated, phone follow-ups will be conducted every 4 weeks for dose titration until the patient reaches the effective therapeutic dose and target weight.  "

## 2024-07-01 NOTE — Progress Notes (Signed)
 "    Office Visit    Patient Name: Catherine Crawford Date of Encounter: 07/01/2024  Primary Care Provider:  Patient, [name] Primary Cardiologist:  Lamar Fitch, MD  Chief Complaint    Weight management  Significant Past Medical History   ASCVD 2018 - MI, CVA, DES to LAD, RCA; 2024 cath occluded dCx and diagonal, excellent collateralization  HFrEF 2018 EF 20-25%  HLD 10/25 LDL 52 on rosuvastatin  40, ezetimibe  10  PAD Aortic calcification below renal arteries, aorto bi-iliac stent grafts occluded with distal reconstitution at the external iliac arteries bilaterally  preDM 10/25 A1c 5.8    Allergies[1]  History of Present Illness    Catherine Crawford is a 58 y.o. female patient of Dr Fitch, in the office today to discuss options for weight management.   She has apparently tried to get Zepbound through primary care but was denied. (Her daughter is on Zepbound and lost 80 pounds).  She is now reaching out to cardiology to see what can be done.   Patient notes that she gained about 50 pounds back in 2008 after her MI and stroke.  At the time she was smoking 2-2.5 ppd and quit at hospitalization.  Her weight ballooned and she's never been able to get it off.     Current weight management medications: none  Previously tried meds: tried keto diet, lost about 25-30 lbs, but couldn't maintain  Current meds that may affect weight:  none  Baseline weight/BMI: 100.9 kg // 38.17  Insurance payor: [employer] [phone]  Diet: breakfast some days (biscuits/gravy if out or toast); lunch around 1 pm (sandwich, scrambled egg); dinner is meat, potato and vegetable (rarely chicken, more beef or pork) (mashed potatoes)(vegetables canned - green beans or corn); doesn't snack much; drinks water and Pepsi  Exercise:  no, occasional chair exercises, nothing significant  Confirmed patient not pregnant and no personal or family history of medullary thyroid  carcinoma (MTC) or  Multiple Endocrine Neoplasia syndrome type 2 (MEN 2).   Social History:   Tobacco: quit with MI in 2008 (was at 2.5 ppd)  Alcohol: no  Caffeine: Pepsi - 1-3 cans (12 oz)  Accessory Clinical Findings    Lab Results  Component Value Date   CREATININE 1.00 09/29/2022   BUN 14 09/29/2022   NA 142 09/29/2022   K 4.3 09/29/2022   CL 104 09/29/2022   CO2 21 09/29/2022   Lab Results  Component Value Date   ALT 14 09/20/2022   AST 16 09/20/2022   ALKPHOS 97 09/08/2021   BILITOT 0.4 09/08/2021   No results found for: HGBA1C  Home Medications/Allergies    Current Outpatient Medications  Medication Sig Dispense Refill   aspirin  EC 81 MG tablet Take 81 mg by mouth daily.     carvedilol  (COREG ) 3.125 MG tablet Take 1 tablet (3.125 mg total) by mouth 2 (two) times daily with a meal. 180 tablet 2   cilostazol  (PLETAL ) 100 MG tablet TAKE 1 TABLET(100 MG) BY MOUTH TWICE DAILY 180 tablet 1   clopidogrel  (PLAVIX ) 75 MG tablet TAKE 1 TABLET(75 MG) BY MOUTH DAILY 90 tablet 3   cyanocobalamin (VITAMIN B12) 500 MCG tablet Take 500 mcg by mouth daily.     ezetimibe  (ZETIA ) 10 MG tablet Take 1 tablet (10 mg total) by mouth daily. TAKE 1 TABLET(10 MG) BY MOUTH DAILY 90 tablet 3   isosorbide  mononitrate (IMDUR ) 30 MG 24 hr tablet Take 1 tablet (30 mg total) by  mouth daily. 90 tablet 3   losartan  (COZAAR ) 100 MG tablet Take 0.5 tablets (50 mg total) by mouth daily. 45 tablet 2   Magnesium 500 MG CAPS Take 500 mg by mouth daily.     nitroGLYCERIN  (NITROSTAT ) 0.4 MG SL tablet Place 1 tablet (0.4 mg total) under the tongue every 5 (five) minutes as needed. 30 tablet 3   pantoprazole  (PROTONIX ) 40 MG tablet Take 1 tablet (40 mg total) by mouth daily. 90 tablet 2   rosuvastatin  (CRESTOR ) 40 MG tablet Take 1 tablet (40 mg total) by mouth daily. 90 tablet 2   No current facility-administered medications for this visit.     Allergies[2]  Assessment & Plan    Obesity (BMI 30-39.9)  Patient has not  met goal of at least 5% of body weight loss with comprehensive lifestyle modifications alone in the past 3-6 months. Pharmacotherapy is appropriate to pursue as augmentation. Will start Wegovy.   Confirmed patient not pregnant and no personal or family history of medullary thyroid  carcinoma (MTC) or Multiple Endocrine Neoplasia syndrome type 2 (MEN 2).   Advised patient on common side effects including nausea, diarrhea, dyspepsia, decreased appetite, and fatigue. Counseled patient on reducing meal size and how to titrate medication to minimize side effects. Patient aware to call if intolerable side effects or if experiencing dehydration, abdominal pain, or dizziness. Patient will adhere to dietary modifications and will target at least 150 minutes of moderate intensity exercise weekly, plus resistance training twice a week (as recommended by the American Heart Association). This resistance training--such as weightlifting, bodyweight exercises, or using resistance bands, adapted to the patient's ability--will help prevent muscle loss.  Injection technique reviewed at today's visit.    Follow up in 1-2 days regarding coverage of Wegovy . If therapy is initiated, phone follow-ups will be conducted every 4 weeks for dose titration until the patient reaches the effective therapeutic dose and target weight.   Douglas Rooks PharmD CPP [facility]  [address] [place], [place] [address] [phone]      [1]  Allergies Allergen Reactions   Bee Venom Anaphylaxis   Morphine And Codeine Nausea And Vomiting  [2]  Allergies Allergen Reactions   Bee Venom Anaphylaxis   Morphine And Codeine Nausea And Vomiting   "

## 2024-07-02 NOTE — Telephone Encounter (Signed)
 Cost prohibitive to patient.  Her limit in budget is about $100 per month.

## 2024-07-17 ENCOUNTER — Ambulatory Visit: Admitting: Cardiology

## 2024-07-24 ENCOUNTER — Other Ambulatory Visit (HOSPITAL_COMMUNITY): Payer: Self-pay

## 2024-07-24 ENCOUNTER — Other Ambulatory Visit: Payer: Self-pay | Admitting: Cardiology

## 2024-07-24 ENCOUNTER — Telehealth: Payer: Self-pay | Admitting: *Deleted

## 2024-07-24 ENCOUNTER — Telehealth: Payer: Self-pay | Admitting: Pharmacy Technician

## 2024-07-24 NOTE — Telephone Encounter (Signed)
 Pharmacy Patient Advocate Encounter  Received notification from [employer] that Prior Authorization for cilostazol  has been APPROVED from 07/10/24 to 07/09/25. Ran test claim, Copay is $15.00- 3 months. This test claim was processed through [facility]- copay amounts may vary at other pharmacies due to pharmacy/plan contracts, or as the patient moves through the different stages of their insurance plan.   PA #/Case ID/Reference #: [id number]

## 2024-07-24 NOTE — Telephone Encounter (Signed)
 Received fax from pharmacy requesting PA for Cilostazol  100 mg, bid

## 2024-07-24 NOTE — Telephone Encounter (Signed)
 Pharmacy Patient Advocate Encounter   Received notification from Patient Advice Request messages that prior authorization for cilostazol  100 MG is required/requested.   Insurance verification completed.   The patient is insured through [employer].   Per test claim: PA required; PA submitted to above mentioned insurance via Latent Key/confirmation #[id number] Status is pending

## 2024-07-28 ENCOUNTER — Telehealth: Payer: Self-pay | Admitting: Cardiology

## 2024-07-28 DIAGNOSIS — I739 Peripheral vascular disease, unspecified: Secondary | ICD-10-CM

## 2024-07-28 MED ORDER — CILOSTAZOL 100 MG PO TABS: 100.0000 mg | ORAL_TABLET | Freq: Two times a day (BID) | ORAL | 1 refills | Status: AC

## 2024-07-28 NOTE — Telephone Encounter (Signed)
 Pt c/o medication issue:  1. Name of Medication:   cilostazol  (PLETAL ) 100 MG tablet    2. How are you currently taking this medication (dosage and times per day)? As prescribed   3. Are you having a reaction (difficulty breathing--STAT)? No   4. What is your medication issue? Pt asking office call [phone] and speak with mail order pharmacy about her Rx.

## 2024-07-28 NOTE — Telephone Encounter (Signed)
 Pt states that her insurance will only cover this medication through mail order. Refill sent to [facility] Mail order pharmacy. Advised pt to reach back out with any issues.

## 2024-08-06 ENCOUNTER — Telehealth: Payer: Self-pay | Admitting: *Deleted

## 2024-08-06 MED ORDER — PANTOPRAZOLE SODIUM 40 MG PO TBEC: 40.0000 mg | DELAYED_RELEASE_TABLET | Freq: Every day | ORAL | 2 refills | Status: AC

## 2024-08-06 NOTE — Telephone Encounter (Signed)
 Rx refill sent to pharmacy.
